# Patient Record
Sex: Female | Born: 1980 | Race: White | Hispanic: No | Marital: Married | Smoking: Never smoker
Health system: Southern US, Community
[De-identification: ages and names within clinical notes are randomized; demographics above are authoritative.]

## PROBLEM LIST (undated history)

## (undated) DIAGNOSIS — F329 Major depressive disorder, single episode, unspecified: Secondary | ICD-10-CM

## (undated) DIAGNOSIS — F419 Anxiety disorder, unspecified: Secondary | ICD-10-CM

## (undated) DIAGNOSIS — T8859XA Other complications of anesthesia, initial encounter: Secondary | ICD-10-CM

## (undated) DIAGNOSIS — G4730 Sleep apnea, unspecified: Secondary | ICD-10-CM

## (undated) DIAGNOSIS — I10 Essential (primary) hypertension: Secondary | ICD-10-CM

## (undated) DIAGNOSIS — R51 Headache: Secondary | ICD-10-CM

## (undated) DIAGNOSIS — R05 Cough: Secondary | ICD-10-CM

## (undated) DIAGNOSIS — R058 Other specified cough: Secondary | ICD-10-CM

## (undated) DIAGNOSIS — F32A Depression, unspecified: Secondary | ICD-10-CM

## (undated) DIAGNOSIS — T4145XA Adverse effect of unspecified anesthetic, initial encounter: Secondary | ICD-10-CM

## (undated) DIAGNOSIS — K449 Diaphragmatic hernia without obstruction or gangrene: Secondary | ICD-10-CM

## (undated) HISTORY — DX: Essential (primary) hypertension: I10

## (undated) HISTORY — DX: Diaphragmatic hernia without obstruction or gangrene: K44.9

## (undated) HISTORY — PX: APPENDECTOMY: SHX54

## (undated) HISTORY — PX: CHOLECYSTECTOMY: SHX55

## (undated) HISTORY — DX: Sleep apnea, unspecified: G47.30

---

## 1999-04-06 ENCOUNTER — Emergency Department (HOSPITAL_COMMUNITY): Admission: EM | Admit: 1999-04-06 | Discharge: 1999-04-06 | Payer: Self-pay | Admitting: Emergency Medicine

## 1999-10-29 ENCOUNTER — Other Ambulatory Visit: Admission: RE | Admit: 1999-10-29 | Discharge: 1999-10-29 | Payer: Self-pay | Admitting: Internal Medicine

## 2003-03-15 ENCOUNTER — Encounter: Payer: Self-pay | Admitting: *Deleted

## 2003-03-16 ENCOUNTER — Inpatient Hospital Stay (HOSPITAL_COMMUNITY): Admission: EM | Admit: 2003-03-16 | Discharge: 2003-03-18 | Payer: Self-pay | Admitting: Emergency Medicine

## 2003-03-16 ENCOUNTER — Encounter (INDEPENDENT_AMBULATORY_CARE_PROVIDER_SITE_OTHER): Payer: Self-pay | Admitting: Specialist

## 2003-03-28 ENCOUNTER — Other Ambulatory Visit: Admission: RE | Admit: 2003-03-28 | Discharge: 2003-03-28 | Payer: Self-pay | Admitting: Obstetrics and Gynecology

## 2003-05-13 ENCOUNTER — Inpatient Hospital Stay (HOSPITAL_COMMUNITY): Admission: AD | Admit: 2003-05-13 | Discharge: 2003-05-13 | Payer: Self-pay | Admitting: Obstetrics and Gynecology

## 2003-05-17 ENCOUNTER — Inpatient Hospital Stay (HOSPITAL_COMMUNITY): Admission: AD | Admit: 2003-05-17 | Discharge: 2003-05-17 | Payer: Self-pay | Admitting: Obstetrics and Gynecology

## 2003-08-21 ENCOUNTER — Inpatient Hospital Stay (HOSPITAL_COMMUNITY): Admission: AD | Admit: 2003-08-21 | Discharge: 2003-08-21 | Payer: Self-pay | Admitting: Obstetrics and Gynecology

## 2003-09-03 ENCOUNTER — Inpatient Hospital Stay (HOSPITAL_COMMUNITY): Admission: AD | Admit: 2003-09-03 | Discharge: 2003-09-06 | Payer: Self-pay | Admitting: Obstetrics and Gynecology

## 2003-10-18 ENCOUNTER — Other Ambulatory Visit: Admission: RE | Admit: 2003-10-18 | Discharge: 2003-10-18 | Payer: Self-pay | Admitting: Obstetrics and Gynecology

## 2003-10-28 ENCOUNTER — Encounter: Admission: RE | Admit: 2003-10-28 | Discharge: 2003-10-28 | Payer: Self-pay | Admitting: Family Medicine

## 2003-11-04 ENCOUNTER — Encounter (INDEPENDENT_AMBULATORY_CARE_PROVIDER_SITE_OTHER): Payer: Self-pay | Admitting: *Deleted

## 2003-11-05 ENCOUNTER — Inpatient Hospital Stay (HOSPITAL_COMMUNITY): Admission: RE | Admit: 2003-11-05 | Discharge: 2003-11-06 | Payer: Self-pay | Admitting: Surgery

## 2004-04-14 ENCOUNTER — Other Ambulatory Visit: Admission: RE | Admit: 2004-04-14 | Discharge: 2004-04-14 | Payer: Self-pay | Admitting: Obstetrics and Gynecology

## 2005-04-13 ENCOUNTER — Encounter: Admission: RE | Admit: 2005-04-13 | Discharge: 2005-04-13 | Payer: Self-pay | Admitting: Family Medicine

## 2005-11-02 ENCOUNTER — Other Ambulatory Visit: Admission: RE | Admit: 2005-11-02 | Discharge: 2005-11-02 | Payer: Self-pay | Admitting: Family Medicine

## 2008-10-17 ENCOUNTER — Inpatient Hospital Stay (HOSPITAL_COMMUNITY): Admission: AD | Admit: 2008-10-17 | Discharge: 2008-10-17 | Payer: Self-pay | Admitting: Obstetrics and Gynecology

## 2008-11-03 ENCOUNTER — Inpatient Hospital Stay (HOSPITAL_COMMUNITY): Admission: AD | Admit: 2008-11-03 | Discharge: 2008-11-06 | Payer: Self-pay | Admitting: Obstetrics and Gynecology

## 2008-12-05 ENCOUNTER — Encounter: Admission: RE | Admit: 2008-12-05 | Discharge: 2008-12-05 | Payer: Self-pay | Admitting: Family Medicine

## 2010-04-19 ENCOUNTER — Encounter: Payer: Self-pay | Admitting: Obstetrics and Gynecology

## 2010-07-04 LAB — CBC
HCT: 35 % — ABNORMAL LOW (ref 36.0–46.0)
Hemoglobin: 12 g/dL (ref 12.0–15.0)
Hemoglobin: 9.9 g/dL — ABNORMAL LOW (ref 12.0–15.0)
MCHC: 34.2 g/dL (ref 30.0–36.0)
MCV: 91.7 fL (ref 78.0–100.0)
Platelets: 212 10*3/uL (ref 150–400)
Platelets: 294 10*3/uL (ref 150–400)
RBC: 3.82 MIL/uL — ABNORMAL LOW (ref 3.87–5.11)
RDW: 12.9 % (ref 11.5–15.5)
RDW: 13.1 % (ref 11.5–15.5)
WBC: 11.7 10*3/uL — ABNORMAL HIGH (ref 4.0–10.5)

## 2010-07-04 LAB — COMPREHENSIVE METABOLIC PANEL WITH GFR
ALT: 13 U/L (ref 0–35)
AST: 19 U/L (ref 0–37)
Albumin: 3 g/dL — ABNORMAL LOW (ref 3.5–5.2)
Alkaline Phosphatase: 142 U/L — ABNORMAL HIGH (ref 39–117)
BUN: 4 mg/dL — ABNORMAL LOW (ref 6–23)
CO2: 19 meq/L (ref 19–32)
Calcium: 9.3 mg/dL (ref 8.4–10.5)
Chloride: 108 meq/L (ref 96–112)
Creatinine, Ser: 0.45 mg/dL (ref 0.4–1.2)
GFR calc non Af Amer: >60 mL/min
Glucose, Bld: 81 mg/dL (ref 70–99)
Potassium: 3.9 meq/L (ref 3.5–5.1)
Sodium: 136 meq/L (ref 135–145)
Total Bilirubin: 0.8 mg/dL (ref 0.3–1.2)
Total Protein: 6.2 g/dL (ref 6.0–8.3)

## 2010-07-04 LAB — LACTATE DEHYDROGENASE: LDH: 127 U/L (ref 94–250)

## 2010-07-04 LAB — URIC ACID: Uric Acid, Serum: 4.4 mg/dL (ref 2.4–7.0)

## 2010-08-14 NOTE — Discharge Summary (Signed)
[name], [name]                             ACCOUNT NO.:  [id number]   MEDICAL RECORD NO.:  [id number]                   PATIENT TYPE:  INP   LOCATION:  0478                                 FACILITY:  [facility]   PHYSICIAN:  Sandria Bales. Ezzard Standing, M.D.               DATE OF BIRTH:  Dec 13, 1980   DATE OF ADMISSION:  03/16/2003  DATE OF DISCHARGE:  03/18/2003                                 DISCHARGE SUMMARY   DISCHARGE DIAGNOSES:  1. Acute appendicitis.  2. Twelve-week intrauterine pregnancy.  3. Wellbutrin for anxiety and depression.  4. Hypertension.   OPERATION PERFORMED:  Patient had a laparoscopic appendectomy on March 16, 2003.   HISTORY OF ILLNESS:  This is a 30 year old white female who had abdominal  pain actually beginning about March 09, 2003.  Initially she was seen in  Dr. Lynnell Dike office and felt her abdominal pain could be possibly secondary  to a ovarian cyst however, as her pain got worse she represented to [facility] on Friday, March 15, 2003.  Dr. Malachy Mood who evaluated her obtained  a CT scan which shows apparent appendicitis.  The patient has also had a  known 12-week intrauterine pregnancy.   Her significant co-morbid conditions include she is on labetalol for blood  pressure and she is on Wellbutrin for anxiety/depression.   Patient's temperature was 99.6.  She had right lower quadrant lower  abdominal pain though not really significant.  Her white blood count was  19,000.   Discussion was carried out about the patient proceeding with appendectomy.  She understood with her pregnancy there is a risk of anesthesia and surgery  but there is also a risk for worsening appendicitis which was greater and  exceeded that of the surgery.   She was taken to the operating room on March 16, 2003.  Patient underwent  a laparoscopic appendectomy.   Postoperatively she did well.  Her first postoperative day she was a little  puny.  She had some foul-smelling  urine but a urinalysis was unremarkable.  She was kept on __________ IV antibiotics during her hospitalization for the  2 days but not being discharged on any antibiotics.   She is now ready for discharge.  She is given Vicodin for pain.  She is to  resume her home medication.  She can shower starting later today.  She  should do no driving or heavy lifting for about 2 or 3 days but then go  ahead  unrestricted physical activity.  She is to see me back in 3 weeks.  She  already has an appointment to see Dr. Vincente Poli she thinks on March 28, 2003.  Patient will keep that appointment.  She knows if she runs any fevers  or any other problems she will be in touch with either myself or Dr.  Lynnell Dike office.  Sandria Bales. Ezzard Standing, M.D.    DHN/MEDQ  D:  03/18/2003  T:  03/18/2003  Job:  [id number]   cc:   Gretta Arab. Valentina Lucks, M.D.  [address]  [place]  [place] [address]  Fax: [phone]   Stann Mainland. Vincente Poli, M.D.  [address] [place]  [place] [address]  Fax: [phone]

## 2010-08-14 NOTE — H&P (Signed)
NAMENEENAH, CANTER                             ACCOUNT NO.:  [id number]   MEDICAL RECORD NO.:  [id number]                   PATIENT TYPE:  EMS   LOCATION:  ED                                   FACILITY:  [facility]   PHYSICIAN:  Sandria Bales. Ezzard Standing, M.D.               DATE OF BIRTH:  12-27-1980   DATE OF ADMISSION:  03/16/2003  DATE OF DISCHARGE:                                HISTORY & PHYSICAL   HISTORY OF PRESENT ILLNESS:  This is a 30 year old white female who claims  to have had abdominal pain since last Saturday, which would be March 09, 2003.  She apparently on Sunday talked to, I guess, a nurse practitioner  with Dr. Vincente Poli who thought she may be having some constipation.  They  placed her on Peri-Colace, but she got no better by Wednesday.  Saw Dr.  Vincente Poli in the office who felt she had a cyst.  She started to feel a little  bit better on Wednesday and Thursday of this week, which would be December  15 and 16, 2004, but then Friday started feeling worse again and went to  [facility] at about 12:30.  She was seen and evaluated, found to have  an elevated white blood count, and a CT scan was obtained.   The reading of the CT scan by Dr. Lorenda Ishihara. Molli Posey shows what looks like  appendicitis.   The patient also is noted to be pregnant, at about 12 weeks intrauterine  pregnancy.   PAST MEDICAL HISTORY:  She has no allergies.   Her current medications include labetalol for blood pressure and Wellbutrin,  and then she is on perinatal vitamins.   REVIEW OF SYSTEMS:  NEUROLOGIC:  No history of seizure or loss of  consciousness.  PULMONARY:  No history of pneumonia or tuberculosis.  She  does not smoke cigarettes.  CARDIAC:  She has had hypertension  actually  since she has been about age 19, but no history of chest pain or angina.  GASTROINTESTINAL:  No history of peptic ulcer disease, liver disease,  pancreatic disease, prior abdominal surgery.  UROLOGIC:  No history of  kidney  stones or kidney infection.  There is her first pregnancy, and she is  accompanied by her husband in the emergency room.   PHYSICAL EXAM:  VITAL SIGNS:  Temperature is 99.6, pulse is 93, blood  pressure is 104/74, respirations are 18.  GENERAL:  Well-nourished, obese white female who is alert and cooperative on  physical exam.  HEENT:  Unremarkable.  She does have some acne on her face, and she is  somewhat ruddish in the face.  NECK:  Supple.  She has no mass or thyromegaly.  LUNGS:  Clear to auscultation.  HEART:  Regular rate and rhythm.  Without murmur or rub.  ABDOMEN:  Bowel sounds are present.  She has some lower abdominal tenderness  that is more central and to the right lower quadrant.  She really did not  have that much in the way of guarding or rebound.  She said she hurt a lot  when she hit bumps.  She says she was hurting a lot more when she first made  it to [facility].   Laboratories that I have show a white blood count a 19,000.  Her sodium is  136, potassium 3.8, chloride of 105, CO2 of 23, glucose of 102.  SGPT  slightly elevated at 55.  Her amylase is 64, lipase 25.  Urinalysis  unremarkable.   Again, she had CT scan evidence of appendicitis.   IMPRESSION:  1. Acute appendicitis.  I discussed the findings with her husband and the     patient.  I feel she would be best served with proceeding with     appendectomy.  Will plan to do this laparoscopically.  I told her there     is a chance this would be done open.  I talked about the potential     complications including bleeding, infection, open surgery, bowel injury.     I also talked about the possibility of creating early labor and     miscarriage, though I think the risk of leaving the appendix in is higher     than trying to proceed with the appendectomy.  I think the patient     understands all the benefits and risks of the procedure about going     ahead.  2. A 12-week intrauterine pregnancy.  3.  Wellbutrin for anxiety/depression.  4. Hypertension.                                               Sandria Bales. Ezzard Standing, M.D.    DHN/MEDQ  D:  03/16/2003  T:  03/16/2003  Job:  [id number]   cc:   Tracie Harrier, M.D.  [address] [employer]  [place] [address]  Fax: [phone]   Gretta Arab. Valentina Lucks, M.D.  [address]  [place]  [place] [address]  Fax: [phone]   Stann Mainland. Vincente Poli, M.D.  [address] [place]  [place] [address]  Fax: [phone]

## 2010-08-14 NOTE — H&P (Signed)
[name], [name]                             ACCOUNT NO.:  [id number]   MEDICAL RECORD NO.:  [id number]                   PATIENT TYPE:  INP   LOCATION:  9170                                 FACILITY:  [facility]   PHYSICIAN:  Michelle L. Vincente Poli, M.D.            DATE OF BIRTH:  24-Apr-1980   DATE OF ADMISSION:  09/03/2003  DATE OF DISCHARGE:                                HISTORY & PHYSICAL   HISTORY OF PRESENT ILLNESS:  This is a 30 year old gravida 1, para 0 with  estimated date of confinement of September 30, 2003 presents today for a 2 stage  induction, pending mature amniocentesis. The patient has a history of  chronic hypertension for which she takes 100 mg 2 p.o. q.d. She has had an  issue with borderline fluids with AFI with the last AFI on August 29, 2003 was  11 cm in the 30th percentile. Of note, ultrasound was done on August 29, 2003,  which revealed an estimated fetal weight of 5 pounds 1 ounce at 20th  percentile and biophysical profile was 8 out of 8.   PRENATAL CARE:  See [facility]. GBS is unknown.   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 The father of the baby was tested at [facility] in February  of 2005 and was found to be negative for this.   PAST MEDICAL HISTORY:  She also has a history of depression and anxiety.   PHYSICAL EXAMINATION:  VITAL SIGNS:  She is afebrile with stable vital  signs. Blood pressure is good today.  LUNGS:  Clear to auscultation bilaterally.  CARDIAC:  Regular rate and rhythm.  ABDOMEN:  Leopold's to 5 and 1/2 to 6 pounds.  PELVIC:  Cervix is long and closed.   IMPRESSION:  1. Intrauterine pregnancy at 29 and 2.  2. Chronic hypertension.   PLAN:  Depending on amnio, if mature, patient will be admitted this evening  for a 2 stage induction. She will continue her Labetalol. We will monitor  blood pressures closely.                                               Michelle L. Vincente Poli, M.D.    Florestine Avers  D:  09/03/2003  T:  09/03/2003  Job:  [id number]

## 2010-08-14 NOTE — Op Note (Signed)
NAMEANOUSHKA, [name]                             ACCOUNT NO.:  [id number]   MEDICAL RECORD NO.:  [id number]                   PATIENT TYPE:  OBV   LOCATION:  0445                                 FACILITY:  [facility]   PHYSICIAN:  Sandria Bales. Ezzard Standing, M.D.               DATE OF BIRTH:  [birth year]   DATE OF PROCEDURE:  11/04/2003  DATE OF DISCHARGE:                                 OPERATIVE REPORT   PREOPERATIVE DIAGNOSES:  1. Chronic cholecystitis and cholelithiasis.  2. Elevated liver functions.   POSTOPERATIVE DIAGNOSES:  1. Chronic cholecystitis and cholelithiasis.  2. Common bile duct obstruction distally, probably secondary to common bile     duct stones with elevated liver function.   PROCEDURE:  Laparoscopic cholecystectomy with intraoperative cholangiogram.   SURGEON:  Sandria Bales. Ezzard Standing, M.D.   FIRST ASSISTANT:  None.   ANESTHESIA:  General.   ESTIMATED BLOOD LOSS:  Minimal.   INDICATIONS FOR PROCEDURE:  [name] is a 30 year old white female who is  about eight weeks postpartum who comes with increasingly symptomatic  gallstones.  The indications and potential complications of the procedure  were explained to the patient.  The potential complications include but are  not limited to bleeding, infection, bile duct injury, and open surgery.   The patient was given general endotracheal anesthesia, given 1 gm of Ancef  at the initiation of the procedure.  An infraumbilical incision was made  with sharp dissection carried into the abdominal cavity.  A 0 degree  laparoscope was inserted through a 12 mm Hasson trocar and secured with a 0  Vicryl suture.  A 10 mm subxiphoid incision was made for a 10 mm trocar and  then two 5 mm right subcostal trocars.  Abdominal exploration revealed the  right and left lobes of the liver were unremarkable.  The bowel that could  be seen and the stomach were unremarkable.  There were no other masses,  lesions, or nodularity.  There was actually no  scarring from her prior  appendectomy.   The gallbladder was grasped and rotated cephalad.  It had a thickened wall  but was not exceptionally inflamed.  The patient was noted to have elevated  liver functions at the beginning of the procedure with a bilirubin of 1.9,  an alk phos of 304, SGOT of 260, SGPT of 203.   Intraoperative cholangiogram was obtained.  The cystic artery was identified  and ligated with Endoclips.  A clip placed on the gallbladder side of the  cystic duct and the intraoperative cholangiogram obtained using cut-off taut  catheter and inserted to the cut cystic duct.  I really had difficulty  getting the flow because of the valve system of the cystic duct.  When I  finally got to the cholangiogram, it did not empty distally.  Did fill up  the proximal hepatic radicals, and this was consistent with a distal  common  bile duct obstruction, although I saw no meniscal sign.  Neither was there  obvious taper, like edema or anything.   I tried to place a biliary Fogarty down the cystic duct.  I actually cut the  cystic duct a second time, trying to get closer to the common bile duct but  could not get past the valve system.  I could also not reinsert a taut  catheter shooting up the cholangiogram; therefore, I clipped the duct three  times, divided the cystic duct with the plans for doing a postoperative  ERCP.  The gallbladder was then sharply and bluntly dissected from the  gallbladder bed.  Prior to completing the division of the gallbladder from  the gallbladder bed, I relooked at the cystic duct.  There was no bile leak.  I relooked at the liver bed.  There was no bleeding or bile leak.  I then  divided the gallbladder from the gallbladder bed, put it in the EndoCatch  bag and delivered it through the umbilicus.  I then closed the umbilical  port with a 0 Vicryl suture.  The other ports were visualized and no  bleeding from them.  I closed the skin site.  The right  mid subcostal 5 mm  port did have some bruising.  I had to put a cutaneous stitch of 5-0  Monocryl.  The wounds were then painted with tincture of Benzoin, Steri-  Strip, and sterilely dressed.  The patient tolerated the procedure well.  Was transported to the recovery room in good condition.                                               Sandria Bales. Ezzard Standing, M.D.    DHN/MEDQ  D:  11/04/2003  T:  11/04/2003  Job:  [id number]   cc:   Gretta Arab. Valentina Lucks, M.D.  [address]  [place]  [place] [address]  Fax: [phone]   Stann Mainland. Vincente Poli, M.D.  [address]  [place]  [place] [address]  Fax: [phone]   Graylin Shiver, M.D.  [phone] [address]  [address]  [place], [place] [address]  Fax: [phone]

## 2010-08-14 NOTE — Consult Note (Signed)
[name], [name]                             ACCOUNT NO.:  [id number]   MEDICAL RECORD NO.:  [id number]                   PATIENT TYPE:  OBV   LOCATION:  0445                                 FACILITY:  [facility]   PHYSICIAN:  James L. Malon Kindle., M.D.          DATE OF BIRTH:  1981/03/15   DATE OF CONSULTATION:  11/04/2003  DATE OF DISCHARGE:                                   CONSULTATION   HISTORY:  A 30 year old woman who is approximately 10 months postpartum who  has had several weeks of epigastric pain, postoperative bleeding, and  nausea. She saw Dr. Maurice Small at this point and ultrasound of the  gallbladder was obtained showing multiple small stones of the gallbladder  and a laparoscopic cholecystectomy was performed today. Intraoperative  cholangiogram revealed no gross filling defect, however there was no  emptying of the distal bile duct and no passage of contrast into the  duodenum. Preoperatively, the patient's labs revealed a bilirubin of 1.9,  alkaline phosphatase 304, SGOT 260, SGPT 203. Amylase and lipase normal. It  was felt that the patient may well have a common bile duct stone. We are  asked to see her for further evaluation.   MEDICATIONS ON ADMISSION:  Labetalol.   ALLERGIES:  No known drug allergies.   PAST MEDICAL HISTORY:  1. High blood pressure.  2. Appendectomy.  3. Some sort of abnormality with __________  .  She is quite sedated now and     her husband is unclear on this, but apparently she had some defect, he     does not.  4. Depression.  5. Anxiety.   She had one baby who was born approximately 10 months ago.   FAMILY HISTORY:  Mother had her gallstones out for unclear reasons.   PHYSICAL EXAMINATION:  GENERAL: The patient has just come back up from the  OR. The patient is quite sedated for pain. This was given to her during the  interview and she __________  .  VITAL SIGNS: Normal.  HEENT: Eyes are full and nonicteric.  LUNGS: Clear.  HEART: Regular rate and rhythm without murmurs, rubs, or gallops.  ABDOMEN: Surgical scars. It is quiet and appears tender.   ASSESSMENT:  Abnormal postoperative cholangiogram, abnormal liver function  tests may well represent a common duct stone versus __________   PLAN:  Will go ahead and check the labs in the morning. Depending on the  results of that she may well need an ERCP.  I have discussed in detail with  her and in particular with her husband ERCP, sphincterotomy, the rationale  of her performing this procedure, and the risks of pancreatitis and bleeding  than can be associated with it.  James L. Malon Kindle., M.D.    Waldron Session  D:  11/04/2003  T:  11/04/2003  Job:  [id number]   cc:   Gretta Arab. Valentina Lucks, M.D.  [address] [place]  [place] [address]  Fax: [phone]

## 2010-08-14 NOTE — Discharge Summary (Signed)
[name], [name]                             ACCOUNT NO.:  [id number]   MEDICAL RECORD NO.:  [id number]                   PATIENT TYPE:  INP   LOCATION:  0445                                 FACILITY:  [facility]   PHYSICIAN:  Sandria Bales. Ezzard Standing, M.D.               DATE OF BIRTH:  01-25-1981   DATE OF ADMISSION:  11/04/2003  DATE OF DISCHARGE:  11/06/2003                                 DISCHARGE SUMMARY   DISCHARGE DIAGNOSES:  1.  Chronic cholecystitis with cholelithiasis.  2.  Common bile duct stones with obstruction.  3.  Elevated liver functions secondary to common bile duct stones.  4.  Hypertension.  5.  Recent pregnancy.   OPERATIONS PERFORMED:  The patient had a laparoscopic cholecystectomy with  intraoperative cholangiogram on November 04, 2003, and then had an endoscopic  retrograde cholangiopancreatography with sphincterotomy on November 05, 2003,  by Dr. Wandalee Ferdinand.   HISTORY OF PRESENT ILLNESS:  [name] is a 30 year old white female,  patient of Dr. Maurice Small, who is well known to me for a laparoscopic  appendectomy that I performed on her in December 2004, during her pregnancy.  She delivered a healthy female approximately eight weeks prior to this  admission.  During her pregnancy she developed recurrent epigastric and  right upper quadrant abdominal pain which initially was attributed to gas  pain and pregnancy, however, approximately on October 25, 2003, she developed  severe pain, went to the [facility], had an ultrasound of her  gallbladder which showed multiple gallstones.   She now presents for attempted laparoscopic cholecystectomy.   PAST MEDICAL HISTORY:  High blood pressure and is being treated for this.  She is otherwise healthy, though moderately obese.   LABORATORY DATA:  Her admission labs did show some elevated liver function  tests with an AST of 260, and ALT of 203, and alkaline phosphatase of 304,  total bilirubin of 1.9.   HOSPITAL COURSE:   She was taken to the operating room on the day of  admission where she underwent a laparoscopic cholecystectomy with an  intraoperative cholangiogram.  Intraoperative cholangiogram showed lack of  emptying of the common bile duct which is probably secondary to a distal  common bile duct stone.  Again, she was noted to have elevated liver  function tests.   I spoke with Dr. Berton Mount who was on call that day for [facility].  He  then got Dr. Wandalee Ferdinand involved.  Dr. Evette Cristal did an ERCP with sphincterotomy  on November 05, 2003, and did find a distal common bile duct stone which was  impacted obstructing her common bile duct.  The following day on November 06, 2003, she was afebrile, she was doing well with minimal discomfort.  Her  liver functions actually bumped up a little bit further with a bilirubin of  5.4, alkaline phosphatase was 364.  However, she clinically was much  improved.  I think her problem with the stone had been identified, and this  was a transient elevation post-procedure, and therefore she was ready to be  discharged home.   DISCHARGE MEDICATIONS:  1.  Vicodin for pain.  2.  Resume her home medications.   DIET:  Low fat diet for at least one month.   FOLLOWUP:  See me back in two to three weeks for followup, and to call for  any problems that she may have.                                               Sandria Bales. Ezzard Standing, M.D.    DHN/MEDQ  D:  11/26/2003  T:  11/26/2003  Job:  [id number]   cc:   Gretta Arab. Valentina Lucks, M.D.  [address] [place]  [place] [address]  Fax: [phone]   Graylin Shiver, M.D.  [address]  [address]  [place], [place] [address]  Fax: [phone]

## 2010-08-14 NOTE — Op Note (Signed)
NAMEWILMARY, LEVIT                             ACCOUNT NO.:  [id number]   MEDICAL RECORD NO.:  [id number]                   PATIENT TYPE:  EMS   LOCATION:  ED                                   FACILITY:  [facility]   PHYSICIAN:  Sandria Bales. Ezzard Standing, M.D.               DATE OF BIRTH:  [birth year]   DATE OF PROCEDURE:  03/16/2003  DATE OF DISCHARGE:                                 OPERATIVE REPORT   PREOPERATIVE DIAGNOSIS:  Acute appendicitis.   POSTOPERATIVE DIAGNOSIS:  Acute appendicitis.   PROCEDURE:  Laparoscopic appendectomy.   SURGEON:  Sandria Bales. Ezzard Standing, MD.   FIRST ASSISTANT:  None.   ANESTHESIA:  General endotracheal.   ESTIMATED BLOOD LOSS:  Minimal.   INDICATIONS FOR PROCEDURE:  Ms. Raabe is a 30 year old, white female, who is  [redacted] weeks pregnant, who comes with diagnosis of acute appendicitis.  She has  actually had some symptoms for 5-6 days, and I'm not sure these are all  symptoms due to appendicitis.  Anyway, I discussed with the patient and her  husband the indications and potential risks for the surgery.  Potential  risks include but not limited to bleeding, infection, bowel injury, open  surgery, and the possibility of premature labor/miscarriage.  The patient  understands the risks of the procedure, also understands the risk of a  progressive appendicitis and the danger it poses.  The patient now presents  to the operating room.  She has been given ciprofloxacin as an antibiotic.  She had a Foley catheter in place.   DESCRIPTION OF PROCEDURE:  Her abdomen is prepped with Betadine solution and  sterilely draped.  I went through an infraumbilical incision and got into  the abdominal cavity.  Abdominal exploration carried out.  The patient has a  gravid uterus, but the uterus is not all that big, and it is not really  affecting any evaluation of the pelvis.  Her appendix was actually stuck at  her pelvic brim.  I had to flip this up, but actually was able to mobilize  it  once I got it unstuck.  I could not see any injury to any bowel or any  stuck bowel around it.  I was then able to take the mesentery of the  appendix down using harmonic scalpel.  Stapled across the base of the  appendix was a standard white load of the EndoGIA stapler, a 45 mm.  I then  place the appendix in an EndoCatch bag and delivered it through the  umbilicus.  I irrigated the abdomen out with about 500 mL of saline.  I  reexamined the staple line which was intact, the bowel which was intact, saw  no other cause of pathology, and then removed all the trocars under direct  visualization.  The umbilical trocar port was closed with a 0 Vicryl suture.  Each port was infiltrated with 0.25% Marcaine,  using a total of about 17 mL.  I then closed the incisions with a 5-0 Monocryl suture.  The patient then  transferred to the recovery room in good condition.  Sponge and needle count  were correct at the end of the case.                                               Sandria Bales. Ezzard Standing, M.D.    DHN/MEDQ  D:  03/16/2003  T:  03/16/2003  Job:  [id number]   cc:   Marcelino Duster L. Vincente Poli, M.D.  [address], [address]  [place]  [place] [address]  Fax: [phone]   Tracie Harrier, M.D.  [address] [place]  [place] [address]  Fax: [phone]   Gretta Arab. Valentina Lucks, M.D.  [address] [place]  [place] [address]  Fax: [phone]

## 2010-08-14 NOTE — Op Note (Signed)
NAMEJASKIRAT, [name]                             ACCOUNT NO.:  [id number]   MEDICAL RECORD NO.:  [id number]                   PATIENT TYPE:  OBV   LOCATION:  0445                                 [facility]ACILITY:  [facility]   PHYSICIAN:  Graylin Shiver, M.D.                DATE OF BIRTH:  1980/06/19   DATE OF PROCEDURE:  11/05/2003  DATE OF DISCHARGE:                                 OPERATIVE REPORT   PROCEDURE:  Endoscopic retrograde cholangiogram with sphincterotomy and  stone extraction.   INDICATIONS FOR PROCEDURE:  This patient is a 30 year old female one day  status post laparoscopic cholecystectomy.  Intraoperative cholangiograms  reveal the probability of a distal common bile duct stone, as no contrast  would pass into the duodenum.  Today, her liver enzymes were still elevated,  and the patient was experiencing right upper quadrant abdominal pain.  It  was felt that ERCP was indicated.   Informed consent was obtained after explanation of the risks of bleeding,  infection, perforation, and pancreatitis.   PREMEDICATIONS:  Fentanyl 100 mcg IV, Versed 12 mg IV, Glucagon 0.5 mg IV.   PROCEDURE:  With the patient in the left lateral decubitus position, the  Olympus lateral-viewing duodenoscope was inserted into the oropharynx and  passed into the esophagus.  It was advanced down the esophagus and into the  stomach.  No abnormalities were seen in the stomach.  The scope was then  advanced into the duodenum.  No abnormalities were seen in the duodenal  bulb.  The papilla of Vater was located in the second portion of the  duodenum, and the papilla appeared to be bulging.  Right at the orifice, I  could see a yellowish-black discoloration, which appeared to me that the tip  of the stone was trying to pass out of the papilla and was, in fact,  impacted in this region.  Cannulation of the common bile duct was achieved  using a guidewire and papillotome.  A small sphincterotomy was made after  proper localization of the sphincterotome.  With this small sphincterotomy,  a round pea-sized stone was extracted from the common bile duct ampullary  area.  After this, good bile flow was achieved.  The papillotome was then  removed.  The guidewire was left in place.  I then advanced an 8.5 mm  balloon up the bile duct after injection of the biliary tree had been done  and showed no further filling defects.  The duct was swept.  No further  defects were noted.  The balloon was deflated and brought out of the  papilla.  Final picture showed clearance of the common bile duct and biliary  tree.  The pancreatic duct was not injected during this examination.  It was  cannulated once initially with only a guidewire.  She tolerated the  procedure well without complications.   IMPRESSION:  Impacted common bile  duct stone in the region of the ampulla of  Vater.  This was removed.                                               Graylin Shiver, M.D.    [name]  D:  11/05/2003  T:  11/05/2003  Job:  [id number]   cc:   Sandria Bales. Ezzard Standing, M.D.  [address]  [place]  [place] [address]  Fax: [phone]

## 2012-01-05 ENCOUNTER — Other Ambulatory Visit: Payer: Self-pay | Admitting: Obstetrics and Gynecology

## 2013-04-19 ENCOUNTER — Other Ambulatory Visit: Payer: Self-pay | Admitting: Dermatology

## 2013-06-13 ENCOUNTER — Other Ambulatory Visit: Payer: Self-pay | Admitting: Obstetrics and Gynecology

## 2013-06-21 ENCOUNTER — Encounter (INDEPENDENT_AMBULATORY_CARE_PROVIDER_SITE_OTHER): Payer: Self-pay | Admitting: Surgery

## 2013-06-21 ENCOUNTER — Ambulatory Visit (INDEPENDENT_AMBULATORY_CARE_PROVIDER_SITE_OTHER): Payer: BC Managed Care – PPO | Admitting: Surgery

## 2013-06-21 NOTE — Progress Notes (Signed)
Re:   [name] DOB:   September 23, 1980 MRN:   [id number]  ASSESSMENT AND PLAN: 1.  Morbid obesity  Initial weight - 276, BMI 42.6  Per the 1991 NIH Consensus Statement, the patient is a candidate for bariatric surgery.  The patient attended our initial information session and reviewed the types of bariatric surgery.    The patient is interested in the Sleeve Gastrectomy.  I discussed with the patient the indications and risks of bariatric surgery.  The potential risks of surgery include, but are not limited to, bleeding, infection, leak from the bowel, DVT and PE, open surgery, long term nutrition consequences, and death.  The patient understands the importance of compliance and long term follow-up with our group after surgery.  She understands that it is the newest of our operations and that we have the least experience with the surgery. I have started doing this surgery, but do not have large numbers. The preliminary results and risks seem to be between the lap band and the gastric bypass.  From here we will obtain lab tests, x-rays, nutrition consult, and psych consult.  2.  HTN 3.  OSA 4.  On birth control pills  Chief Complaint  Patient presents with  . Bariatric Pre-op    sleeve   REFERRING PHYSICIAN: Astrid Divine, MD  HISTORY OF PRESENT ILLNESS: [name] is a 33 y.o. (DOB: 22-Jun-1980)  white  female whose primary care physician is Astrid Divine, MD and comes to me today for weight loss surgery.  Patient says that she has been overweight since age 7. She has tried multiple diets during that time. She went to the information session by Dr. Ovidio Kin. She has a friend of a friend who had a sleeve gastrectomy. She knows patient had a gastric bypass. But the gastric bypass scares her.  She has tried Weight Watchers x2, low calorie diet, LA Diet, and multiple other diets. She tried Slim fast and phentermine. She had a mall success with about a 20 pound weight loss  on Weight Watchers one time, but nothing sustained.  Note her husband does most of the cooking at home.  But she is already trying to change what they have in their home.   No past medical history on file.   No past surgical history on file.    Current Outpatient Prescriptions  Medication Sig Dispense Refill  . ALPRAZolam (XANAX) 0.25 MG tablet       . AMETHYST 90-20 MCG tablet       . hydrochlorothiazide (MICROZIDE) 12.5 MG capsule Take 12.5 mg by mouth daily.      Marland Kitchen FLUoxetine (PROZAC) 20 MG capsule        No current facility-administered medications for this visit.     No Known Allergies  REVIEW OF SYSTEMS: Skin:  No history of rash.  No history of abnormal moles. Infection:  No history of hepatitis or HIV.  No history of MRSA. Neurologic:  No history of stroke.  No history of seizure.  No history of headaches. Cardiac:  HTN x 1 years.  No history of seeing a cardiologist. Pulmonary:  Has OSA since around 2008.  But cannot wear CPAP.  She actually thinks it may be better, though now her husband's snoring bothers her.  Endocrine:  No diabetes. No thyroid disease. Gastrointestinal:  No history of stomach disease.  No history of liver disease.  Dr. Ezzard Standing did lap chole 11/04/2003.  She had CBD stones - Dr. Luan Moore  did ERCP - 8.11/2013 No history of pancreas disease.  No history of colon disease.  Dr. Ezzard StandingNewman did lap appendectomy 03/16/2003. Urologic:  No history of kidney stones.  No history of bladder infections. Musculoskeletal:  No history of joint or back disease. Hematologic:  No bleeding disorder.  No history of anemia.  Not anticoagulated. Psycho-social:  The patient is oriented.   The patient has no obvious psychologic or social impairment to understanding our conversation and plan.  SOCIAL and FAMILY HISTORY: Married. Works as Agricultural engineerdirector of graduate school admissions at [employer] Has 2 boys:  Ages 4 and 289.  PHYSICAL EXAM: BP 132/62  Pulse 68  Temp(Src) 98 F (36.7 C)   Resp 18  Ht 5' 7.5" (1.715 m)  Wt 276 lb 3.2 oz (125.283 kg)  BMI 42.60 kg/m2  General: WN obese WF who is alert and generally healthy appearing.  HEENT: Normal. Pupils equal. Neck: Supple. No mass.  No thyroid mass. Lymph Nodes:  No supraclavicular or cervical nodes. Lungs: Clear to auscultation and symmetric breath sounds. Heart:  RRR. No murmur or rub. Abdomen: Soft. No mass. No tenderness. No hernia. Normal bowel sounds.  Umbilical scar.  She is about half pear and half apple. Rectal: Not done. Extremities:  Good strength and ROM  in upper and lower extremities. Neurologic:  Grossly intact to motor and sensory function. Psychiatric: Has normal mood and affect. Behavior is normal.   DATA REVIEWED: Epic notes  Ovidio Kinavid Tramond Slinker, MD,  [facility] [address]  [address]   [place]    [id number] Phone:  [phone] FAX:  [phone]

## 2013-06-22 ENCOUNTER — Other Ambulatory Visit (INDEPENDENT_AMBULATORY_CARE_PROVIDER_SITE_OTHER): Payer: Self-pay

## 2013-07-04 ENCOUNTER — Ambulatory Visit (HOSPITAL_COMMUNITY)
Admission: RE | Admit: 2013-07-04 | Discharge: 2013-07-04 | Disposition: A | Discharge status: Home / Self Care | Payer: BC Managed Care – PPO | Source: Ambulatory Visit | Attending: Surgery | Admitting: Surgery

## 2013-07-04 DIAGNOSIS — Z6841 Body Mass Index (BMI) 40.0 and over, adult: Secondary | ICD-10-CM | POA: Insufficient documentation

## 2013-07-04 DIAGNOSIS — Z9089 Acquired absence of other organs: Secondary | ICD-10-CM | POA: Insufficient documentation

## 2013-07-04 DIAGNOSIS — I10 Essential (primary) hypertension: Secondary | ICD-10-CM | POA: Insufficient documentation

## 2013-07-04 DIAGNOSIS — K449 Diaphragmatic hernia without obstruction or gangrene: Secondary | ICD-10-CM | POA: Insufficient documentation

## 2013-07-04 DIAGNOSIS — G4733 Obstructive sleep apnea (adult) (pediatric): Secondary | ICD-10-CM | POA: Insufficient documentation

## 2013-07-09 ENCOUNTER — Ambulatory Visit: Payer: Self-pay | Admitting: Dietician

## 2013-07-20 ENCOUNTER — Ambulatory Visit (HOSPITAL_COMMUNITY)
Admission: RE | Admit: 2013-07-20 | Discharge: 2013-07-20 | Disposition: A | Discharge status: Home / Self Care | Payer: BC Managed Care – PPO | Source: Ambulatory Visit | Attending: Surgery | Admitting: Surgery

## 2013-07-20 ENCOUNTER — Encounter (HOSPITAL_COMMUNITY)
Admission: RE | Disposition: A | Discharge status: Home / Self Care | Payer: BC Managed Care – PPO | Source: Ambulatory Visit | Attending: Surgery

## 2013-07-20 HISTORY — PX: BREATH TEK H PYLORI: SHX5422

## 2013-07-20 SURGERY — BREATH TEST, FOR HELICOBACTER PYLORI

## 2013-07-23 ENCOUNTER — Encounter (HOSPITAL_COMMUNITY): Payer: Self-pay | Admitting: Surgery

## 2013-08-04 ENCOUNTER — Encounter: Payer: BC Managed Care – PPO | Attending: Surgery | Admitting: Dietician

## 2013-08-04 ENCOUNTER — Encounter: Payer: Self-pay | Admitting: Dietician

## 2013-08-04 DIAGNOSIS — Z01818 Encounter for other preprocedural examination: Secondary | ICD-10-CM | POA: Insufficient documentation

## 2013-08-04 DIAGNOSIS — Z713 Dietary counseling and surveillance: Secondary | ICD-10-CM | POA: Insufficient documentation

## 2013-08-04 NOTE — Progress Notes (Signed)
  Pre-Op Assessment Visit:  Pre-Operative Gastric sleeve Surgery  Medical Nutrition Therapy:  Appt start time: 1245   End time:  1330.  Patient was seen on 08/04/2013 for Pre-Operative Gastric sleeve Nutrition Assessment. Assessment [facility] coordinator on 08/04/2013.   Preferred Learning Style:   No preference indicated   Learning Readiness:   Ready  Handouts given during visit include:  Pre-Op Goals Bariatric Surgery Protein Shakes Bariatric fast food guide Phase 3A-High protein foods  Teaching Method Utilized:  Visual Auditory Hands on  Barriers to learning/adherence to lifestyle change: none  Demonstrated degree of understanding via:  Teach Back   [facility] to call the [facility] to enroll in Pre-Op and Post-Op Nutrition Education when surgery date is scheduled.

## 2013-08-13 LAB — CBC WITH DIFFERENTIAL/PLATELET
BASOS ABS: 0 10*3/uL (ref 0.0–0.1)
BASOS PCT: 0 % (ref 0–1)
EOS ABS: 0.1 10*3/uL (ref 0.0–0.7)
EOS PCT: 2 % (ref 0–5)
HEMATOCRIT: 36.1 % (ref 36.0–46.0)
Hemoglobin: 12.5 g/dL (ref 12.0–15.0)
Lymphocytes Relative: 33 % (ref 12–46)
Lymphs Abs: 2 10*3/uL (ref 0.7–4.0)
MCH: 29.6 pg (ref 26.0–34.0)
MCHC: 34.6 g/dL (ref 30.0–36.0)
MCV: 85.5 fL (ref 78.0–100.0)
MONO ABS: 0.3 10*3/uL (ref 0.1–1.0)
Monocytes Relative: 5 % (ref 3–12)
Neutro Abs: 3.6 10*3/uL (ref 1.7–7.7)
Neutrophils Relative %: 60 % (ref 43–77)
PLATELETS: 325 10*3/uL (ref 150–400)
RBC: 4.22 MIL/uL (ref 3.87–5.11)
RDW: 12.7 % (ref 11.5–15.5)
WBC: 6 10*3/uL (ref 4.0–10.5)

## 2013-08-13 LAB — COMPREHENSIVE METABOLIC PANEL
ALT: 21 U/L (ref 0–35)
AST: 18 U/L (ref 0–37)
Albumin: 3.9 g/dL (ref 3.5–5.2)
Alkaline Phosphatase: 70 U/L (ref 39–117)
BILIRUBIN TOTAL: 0.7 mg/dL (ref 0.2–1.2)
BUN: 9 mg/dL (ref 6–23)
CALCIUM: 9.1 mg/dL (ref 8.4–10.5)
CHLORIDE: 107 meq/L (ref 96–112)
CO2: 24 mEq/L (ref 19–32)
CREATININE: 0.72 mg/dL (ref 0.50–1.10)
Glucose, Bld: 83 mg/dL (ref 70–99)
Potassium: 4.1 mEq/L (ref 3.5–5.3)
Sodium: 138 mEq/L (ref 135–145)
Total Protein: 6.8 g/dL (ref 6.0–8.3)

## 2013-08-14 LAB — TSH: TSH: 1.655 u[IU]/mL (ref 0.350–4.500)

## 2013-08-30 ENCOUNTER — Other Ambulatory Visit (INDEPENDENT_AMBULATORY_CARE_PROVIDER_SITE_OTHER): Payer: Self-pay | Admitting: Surgery

## 2013-09-03 ENCOUNTER — Encounter: Payer: BC Managed Care – PPO | Attending: Surgery

## 2013-09-03 DIAGNOSIS — Z713 Dietary counseling and surveillance: Secondary | ICD-10-CM | POA: Insufficient documentation

## 2013-09-03 DIAGNOSIS — Z01818 Encounter for other preprocedural examination: Secondary | ICD-10-CM | POA: Insufficient documentation

## 2013-09-05 ENCOUNTER — Encounter (HOSPITAL_COMMUNITY): Payer: Self-pay | Admitting: Pharmacy Technician

## 2013-09-05 NOTE — Progress Notes (Signed)
  Pre-Operative Nutrition Class:  Appt start time: 0102   End time:  1830.  Patient was seen on 09/03/2013 for Pre-Operative Bariatric Surgery Education at the [facility].   Surgery date: 09/18/2013 Surgery type: Gastric sleeve Start weight at [facility]: 272 lbs on 08/04/2013 Weight today: 273.5 lbs  TANITA  BODY COMP RESULTS  09/03/2013   BMI (kg/m^2) 42.2   Fat Mass (lbs) 139   Fat Free Mass (lbs) 134.5   Total Body Water (lbs) 98.5   Samples given per MNT protocol. Patient educated on appropriate usage: Unjury protein powder (strawberry - qty 1) Lot #: [id number] Exp: 05/2014  Premier protein shake (vanilla - qty 1) Lot #: [id number] Exp: 12/2013  Celebrate Vitamins Multivitamin (mandarin orange - qty 1) Lot #: [id number] Exp: 08/2014  Bariatric Advantage Calcium Citrate (cinnamon - qty 1) Lot #: [id number] Exp: 10/2013   The following the learning objectives were met by the patient during this course:  Identify Pre-Op Dietary Goals and will begin 2 weeks pre-operatively  Identify appropriate sources of fluids and proteins   State protein recommendations and appropriate sources pre and post-operatively  Identify Post-Operative Dietary Goals and will follow for 2 weeks post-operatively  Identify appropriate multivitamin and calcium sources  Describe the need for physical activity post-operatively and will follow MD recommendations  State when to call healthcare provider regarding medication questions or post-operative complications  Handouts given during class include:  Pre-Op Bariatric Surgery Diet Handout  Protein Shake Handout  Post-Op Bariatric Surgery Nutrition Handout  BELT Program Information Flyer  Support Group Information Flyer  [facility] Bariatric Supplements Price List  Follow-Up Plan: Patient will follow-up at [facility] 2 weeks post operatively for diet advancement per MD.

## 2013-09-07 NOTE — Patient Instructions (Addendum)
20     Your procedure is scheduled on:  Tuesday September 18, 2013  [facility] and follow signs to Short Stay  at 1040 AM.  Call this number if you have problems the night before or morning of surgery:  [phone]   Remember:             IF YOU USE CPAP,BRING MASK AND TUBING AM OF SURGERY!             IF YOU DO NOT HAVE YOUR TYPE AND SCREEN DRAWN AT PRE-ADMIT APPOINTMENT, YOU WILL HAVE IT DRAWN AM OF SURGERY!   Do not eat food or drink liquids AFTER MIDNIGHT!  Take these medicines the morning of surgery with A SIP OF WATER:     [facility] IS NOT RESPONSIBLE FOR ANY BELONGINGS OR VALUABLES BROUGHT TO HOSPITAL.  Marland Kitchen.  Leave suitcase in the car. After surgery it may be brought to your room.  For patients admitted to the hospital, checkout time is 11:00 AM the day of              Discharge.    DO NOT WEAR            JEWELRY,MAKE-UP,LOTIONS,POWDERS,PERFUMES,CONTACTS , DENTURES OR BRIDGEWORK ,AND DO NOT WEAR FALSE EYELASHES                                    Patients discharged the day of surgery will not be allowed to drive home.                If going home the same day of surgery, must have someone stay with you                first 24 hrs.at home and arrange for someone to drive you home from the              Hospital.              YOUR DRIVER IS:   Special Instructions:              Please read over the following fact sheets that you were given:             1. [place] PREPARING FOR SURGERY SHEET              2.[facility]                                        WadenaBeverly M.Cathlean Saueranney,RN,BSN     [phone]                              [facility] - Preparing for Surgery Before surgery, you can play an important role.  Because skin is not sterile, your skin needs to be as free of germs as possible.  You can reduce the number of germs on your skin by washing with CHG (chlorahexidine gluconate) soap before surgery.  CHG is an antiseptic cleaner which  kills germs and bonds with the skin to continue killing germs even after washing. Please DO NOT use if you have an allergy to CHG or antibacterial soaps.  If your skin becomes reddened/irritated stop using the CHG and inform your nurse when you arrive  at Short Stay. Do not shave (including legs and underarms) for at least 48 hours prior to the first CHG shower.  You may shave your face/neck. Please follow these instructions carefully:  1.  Shower with CHG Soap the night before surgery and the  morning of Surgery.  2.  If you choose to wash your hair, wash your hair first as usual with your  normal  shampoo.  3.  After you shampoo, rinse your hair and body thoroughly to remove the  shampoo.                           4.  Use CHG as you would any other liquid soap.  You can apply chg directly  to the skin and wash                       Gently with a scrungie or clean washcloth.  5.  Apply the CHG Soap to your body ONLY FROM THE NECK DOWN.   Do not use on face/ open                           Wound or open sores. Avoid contact with eyes, ears mouth and genitals (private parts).                       Wash face,  Genitals (private parts) with your normal soap.             6.  Wash thoroughly, paying special attention to the area where your surgery  will be performed.  7.  Thoroughly rinse your body with warm water from the neck down.  8.  DO NOT shower/wash with your normal soap after using and rinsing off  the CHG Soap.                9.  Pat yourself dry with a clean towel.            10.  Wear clean pajamas.            11.  Place clean sheets on your bed the night of your first shower and do not  sleep with pets. Day of Surgery : Do not apply any lotions/deodorants the morning of surgery.  Please wear clean clothes to the hospital/surgery center.  FAILURE TO FOLLOW THESE INSTRUCTIONS MAY RESULT IN THE CANCELLATION OF YOUR SURGERY PATIENT SIGNATURE_________________________________  NURSE  SIGNATURE__________________________________  ________________________________________________________________________   Rogelia Mire  An incentive spirometer is a tool that can help keep your lungs clear and active. This tool measures how well you are filling your lungs with each breath. Taking long deep breaths may help reverse or decrease the chance of developing breathing (pulmonary) problems (especially infection) following:  A long period of time when you are unable to move or be active. BEFORE THE PROCEDURE   If the spirometer includes an indicator to show your best effort, your nurse or respiratory therapist will set it to a desired goal.  If possible, sit up straight or lean slightly forward. Try not to slouch.  Hold the incentive spirometer in an upright position. INSTRUCTIONS FOR USE  1. Sit on the edge of your bed if possible, or sit up as far as you can in bed or on a chair. 2. Hold the incentive spirometer in an upright position. 3. Breathe out normally. 4. Place  the mouthpiece in your mouth and seal your lips tightly around it. 5. Breathe in slowly and as deeply as possible, raising the piston or the ball toward the top of the column. 6. Hold your breath for 3-5 seconds or for as long as possible. Allow the piston or ball to fall to the bottom of the column. 7. Remove the mouthpiece from your mouth and breathe out normally. 8. Rest for a few seconds and repeat Steps 1 through 7 at least 10 times every 1-2 hours when you are awake. Take your time and take a few normal breaths between deep breaths. 9. The spirometer may include an indicator to show your best effort. Use the indicator as a goal to work toward during each repetition. 10. After each set of 10 deep breaths, practice coughing to be sure your lungs are clear. If you have an incision (the cut made at the time of surgery), support your incision when coughing by placing a pillow or rolled up towels firmly  against it. Once you are able to get out of bed, walk around indoors and cough well. You may stop using the incentive spirometer when instructed by your caregiver.  RISKS AND COMPLICATIONS  Take your time so you do not get dizzy or light-headed.  If you are in pain, you may need to take or ask for pain medication before doing incentive spirometry. It is harder to take a deep breath if you are having pain. AFTER USE  Rest and breathe slowly and easily.  It can be helpful to keep track of a log of your progress. Your caregiver can provide you with a simple table to help with this. If you are using the spirometer at home, follow these instructions: SEEK MEDICAL CARE IF:   You are having difficultly using the spirometer.  You have trouble using the spirometer as often as instructed.  Your pain medication is not giving enough relief while using the spirometer.  You develop fever of 100.5 F (38.1 C) or higher. SEEK IMMEDIATE MEDICAL CARE IF:   You cough up bloody sputum that had not been present before.  You develop fever of 102 F (38.9 C) or greater.  You develop worsening pain at or near the incision site. MAKE SURE YOU:   Understand these instructions.  Will watch your condition. Will get help right away if you are not doing well or get worse.            Bing NeighborsKelly Hagerty  09/10/2013                           YOUR PROCEDURE IS SCHEDULED ON: 09/18/13                ENTER THRU [facility] MAIN HOSPITAL ENTRANCE AND                 FOLLOW  SIGNS TO SHORT STAY CENTER                 ARRIVE AT SHORT STAY AT: 10:40               CALL THIS NUMBER IF ANY PROBLEMS THE DAY OF SURGERY :               [phone]  REMEMBER:   Do not eat food or drink liquids AFTER MIDNIGHT   May have clear liquids UNTIL 6 HOURS BEFORE SURGERY (6:30 AM)               Take these medicines the morning of surgery with               A SIPS OF WATER :    XANAX IF NEEDED      Do not wear jewelry, make-up   Do not wear lotions, powders, or perfumes.   Do not shave legs or underarms 12 hrs. before surgery (men may shave face)  Do not bring valuables to the hospital.  Contacts, dentures or bridgework may not be worn into surgery.  Leave suitcase in the car. After surgery it may be brought to your room.  For patients admitted to the hospital more than one night, checkout time is            11:00 AM                                                       The day of discharge.   Patients discharged the day of surgery will not be allowed to drive home.            If going home same day of surgery, must have someone stay with you              FIRST 24 hrs at home and arrange for some one to drive you              home from hospital.   ________________________________________________________________________                                                                        [facility] - PREPARING FOR SURGERY  Before surgery, you can play an important role.  Because skin is not sterile, your skin needs to be as free of germs as possible.  You can reduce the number of germs on your skin by washing with CHG (chlorahexidine gluconate) soap before surgery.  CHG is an antiseptic cleaner which kills germs and bonds with the skin to continue killing germs even after washing. Please DO NOT use if you have an allergy to CHG or antibacterial soaps.  If your skin becomes reddened/irritated stop using the CHG and inform your nurse when you arrive at Short Stay. Do not shave (including legs and underarms) for at least 48 hours prior to the first CHG shower.  You may shave your face. Please follow these instructions carefully:   1.  Shower with CHG Soap the night before surgery and the  morning of Surgery.   2.  If you choose to wash your hair, wash your hair first as usual with your  normal  Shampoo.   3.  After you shampoo, rinse your hair and body thoroughly to remove  the  shampoo.  4.  Use CHG as you would any other liquid soap.  You can apply chg directly  to the skin and wash . Gently wash with scrungie or clean wascloth    5.  Apply the CHG Soap to your body ONLY FROM THE NECK DOWN.   Do not use on open                           Wound or open sores. Avoid contact with eyes, ears mouth and genitals (private parts).                        Genitals (private parts) with your normal soap.              6.  Wash thoroughly, paying special attention to the area where your surgery  will be performed.   7.  Thoroughly rinse your body with warm water from the neck down.   8.  DO NOT shower/wash with your normal soap after using and rinsing off  the CHG Soap .                9.  Pat yourself dry with a clean towel.             10.  Wear clean pajamas.             11.  Place clean sheets on your bed the night of your first shower and do not  sleep with pets.  Day of Surgery : Do not apply any lotions/deodorants the morning of surgery.  Please wear clean clothes to the hospital/surgery center.  FAILURE TO FOLLOW THESE INSTRUCTIONS MAY RESULT IN THE CANCELLATION OF YOUR SURGERY    PATIENT SIGNATURE_________________________________   Document Released: 07/26/2006 Document Revised: 06/07/2011 Document Reviewed: 09/26/2006 Exit[employer] Patient Information 2014 Exit[employer], [place].   ________________________________________________________________________

## 2013-09-10 ENCOUNTER — Encounter (HOSPITAL_COMMUNITY)
Admission: RE | Admit: 2013-09-10 | Discharge: 2013-09-10 | Disposition: A | Discharge status: Home / Self Care | Payer: BC Managed Care – PPO | Source: Ambulatory Visit | Attending: Surgery | Admitting: Surgery

## 2013-09-10 ENCOUNTER — Encounter (HOSPITAL_COMMUNITY): Payer: Self-pay

## 2013-09-10 ENCOUNTER — Ambulatory Visit (HOSPITAL_COMMUNITY)
Admission: RE | Admit: 2013-09-10 | Discharge: 2013-09-10 | Disposition: A | Discharge status: Home / Self Care | Payer: BC Managed Care – PPO | Source: Ambulatory Visit | Attending: Anesthesiology | Admitting: Anesthesiology

## 2013-09-10 DIAGNOSIS — I10 Essential (primary) hypertension: Secondary | ICD-10-CM | POA: Insufficient documentation

## 2013-09-10 DIAGNOSIS — Z01812 Encounter for preprocedural laboratory examination: Secondary | ICD-10-CM | POA: Insufficient documentation

## 2013-09-10 DIAGNOSIS — Z01818 Encounter for other preprocedural examination: Secondary | ICD-10-CM | POA: Insufficient documentation

## 2013-09-10 HISTORY — DX: Other specified cough: R05.8

## 2013-09-10 HISTORY — DX: Depression, unspecified: F32.A

## 2013-09-10 HISTORY — DX: Cough: R05

## 2013-09-10 HISTORY — DX: Headache: R51

## 2013-09-10 HISTORY — DX: Anxiety disorder, unspecified: F41.9

## 2013-09-10 HISTORY — DX: Major depressive disorder, single episode, unspecified: F32.9

## 2013-09-10 HISTORY — DX: Other complications of anesthesia, initial encounter: T88.59XA

## 2013-09-10 HISTORY — DX: Adverse effect of unspecified anesthetic, initial encounter: T41.45XA

## 2013-09-10 LAB — COMPREHENSIVE METABOLIC PANEL
ALBUMIN: 3.9 g/dL (ref 3.5–5.2)
ALT: 26 U/L (ref 0–35)
AST: 24 U/L (ref 0–37)
Alkaline Phosphatase: 110 U/L (ref 39–117)
BUN: 9 mg/dL (ref 6–23)
CO2: 24 mEq/L (ref 19–32)
CREATININE: 0.69 mg/dL (ref 0.50–1.10)
Calcium: 9.6 mg/dL (ref 8.4–10.5)
Chloride: 102 mEq/L (ref 96–112)
GFR calc Af Amer: >90 mL/min (ref >90)
GFR calc non Af Amer: >90 mL/min (ref >90)
Glucose, Bld: 90 mg/dL (ref 70–99)
POTASSIUM: 4.1 meq/L (ref 3.7–5.3)
Sodium: 138 mEq/L (ref 137–147)
TOTAL PROTEIN: 7.7 g/dL (ref 6.0–8.3)
Total Bilirubin: 0.6 mg/dL (ref 0.3–1.2)

## 2013-09-10 LAB — CBC WITH DIFFERENTIAL/PLATELET
BASOS PCT: 1 % (ref 0–1)
Basophils Absolute: 0 10*3/uL (ref 0.0–0.1)
EOS ABS: 0.1 10*3/uL (ref 0.0–0.7)
Eosinophils Relative: 2 % (ref 0–5)
HCT: 39.8 % (ref 36.0–46.0)
HEMOGLOBIN: 13 g/dL (ref 12.0–15.0)
Lymphocytes Relative: 31 % (ref 12–46)
Lymphs Abs: 2 10*3/uL (ref 0.7–4.0)
MCH: 28.6 pg (ref 26.0–34.0)
MCHC: 32.7 g/dL (ref 30.0–36.0)
MCV: 87.7 fL (ref 78.0–100.0)
Monocytes Absolute: 0.3 10*3/uL (ref 0.1–1.0)
Monocytes Relative: 5 % (ref 3–12)
NEUTROS PCT: 61 % (ref 43–77)
Neutro Abs: 4.1 10*3/uL (ref 1.7–7.7)
Platelets: 344 10*3/uL (ref 150–400)
RBC: 4.54 MIL/uL (ref 3.87–5.11)
RDW: 12 % (ref 11.5–15.5)
WBC: 6.7 10*3/uL (ref 4.0–10.5)

## 2013-09-13 ENCOUNTER — Encounter (INDEPENDENT_AMBULATORY_CARE_PROVIDER_SITE_OTHER): Payer: Self-pay | Admitting: Surgery

## 2013-09-13 ENCOUNTER — Ambulatory Visit (INDEPENDENT_AMBULATORY_CARE_PROVIDER_SITE_OTHER): Payer: BC Managed Care – PPO | Admitting: Surgery

## 2013-09-13 NOTE — Progress Notes (Signed)
Re:   Christine NeighborsKelly Helming DOB:   1980/10/18 MRN:   [id number]  ASSESSMENT AND PLAN: 1.  Morbid obesity  Initial weight - 276, BMI 42.6  Per the 1991 NIH Consensus Statement, the patient is a candidate for bariatric surgery.  The patient attended our initial information session and reviewed the types of bariatric surgery.    The patient is interested in the Sleeve Gastrectomy.  I discussed with the patient the indications and risks of bariatric surgery.  The potential risks of surgery include, but are not limited to, bleeding, infection, leak from the bowel, DVT and PE, open surgery, long term nutrition consequences, and death.  The patient understands the importance of compliance and long term follow-up with our group after surgery.  She understands that it is the newest of our weight loss operations and that we have the least experience with the surgery. I have started doing this surgery, but do not have large numbers. The preliminary results and risks seem to be between the lap band and the gastric bypass.  She also has a small hiatal hernia on UGI.  We will check for this at the time of surgery.  I told [facility], I will try to repair it.  This can add time and risk to the surgery.  I wrote her a prescription for oxycodone elixir 5mg /5cc), 200cc.  Also, we talked about her being out of work for 6 weeks.  2.  HTN 3.  OSA   She does use her CPAP because of the mask.  She is worried about waking up with the mask on her face. 4.  On birth control pills  Chief Complaint  Patient presents with  . Bariatric Pre-op   REFERRING PHYSICIAN: Astrid DivineGRIFFIN,ELAINE COLLINS, MD  HISTORY OF PRESENT ILLNESS: Christine NeighborsKelly Shepard is a 33 y.o. (DOB: 1980/10/18)  white  female whose primary care physician is Astrid DivineGRIFFIN,ELAINE COLLINS, MD and comes to me today for her pre op visit. I asked her to bring her husband this visit, but she came by herself.  She has lost weight since her last visit.  She is excited  about the surgery.  I reviewed the surgery with her.  I reviewed the risks.  She had a friend whose mother had back surgery and got an infection, so she is worried about an infection.  I reviewed with her what we do to prevent infections.  But for us, an intraabdominal infection would make us worry about a leak.  UGI - 07/04/2013 - small HH Dr. Cyndia SkeetersLurey - 08/12/2103  Weight history: Patient says that she has been overweight since age 89. She has tried multiple diets during that time. She went to the information session by Dr. Ovidio Kinavid Kyleen Villatoro. She has a friend of a friend who had a sleeve gastrectomy. She knows patient had a gastric bypass. But the gastric bypass scares her.  She has tried Weight Watchers x2, low calorie diet, [place] Diet, and multiple other diets. She tried Slim fast and phentermine. She had a mall success with about a 20 pound weight loss on Weight Watchers one time, but nothing sustained.  Note her husband does most of the cooking at home.  But she is already trying to change what they have in their home.    Past Medical History  Diagnosis Date  . Hiatal hernia   . Complication of anesthesia     feels panic with face mask  . Hypertension     previously on  BP med - none currently  . Recurrent dry cough   . Headache(784.0)     hx migraines  . Sleep apnea     unable to tolerate mask   . Depression   . Anxiety     xanax prn      Past Surgical History  Procedure Laterality Date  . Breath tek h pylori N/A 07/20/2013    Procedure: BREATH TEK H PYLORI;  Surgeon: Meryl Hubers H Nakari Bracknell, MD;  Location: [facility] ENDOSCOPY;  Service: General;  Laterality: N/A;  . Appendectomy    . Cholecystectomy        Current Outpatient Prescriptions  Medication Sig Dispense Refill  . ALPRAZolam (XANAX) 0.25 MG tablet Take 0.25 mg by mouth daily as needed.       . NASCOBAL 500 MCG/0.1ML SOLN       . AMETHYST 90-20 MCG tablet Take 1 tablet by mouth daily.       . ibuprofen (ADVIL,MOTRIN) 200 MG tablet Take  600 mg by mouth every 6 (six) hours as needed for mild pain or moderate pain.       No current facility-administered medications for this visit.     No Known Allergies  REVIEW OF SYSTEMS: Skin:  No history of rash.  No history of abnormal moles. Infection:  No history of hepatitis or HIV.  No history of MRSA. Neurologic:  No history of stroke.  No history of seizure.  No history of headaches. Cardiac:  HTN x 1 years.  No history of seeing a cardiologist. Pulmonary:  Has OSA since around 2008.  But cannot wear CPAP.  She actually thinks it may be better, though now her husband's snoring bothers her.  Endocrine:  No diabetes. No thyroid disease. Gastrointestinal:  No history of stomach disease.  No history of liver disease.  Dr. Cliford Sequeira did lap chole 11/04/2003.  She had CBD stones - Dr. S. Ganem did ERCP - 8.11/2013 No history of pancreas disease.  No history of colon disease.  Dr. Latisha Lasch did lap appendectomy 03/16/2003. Urologic:  No history of kidney stones.  No history of bladder infections. Musculoskeletal:  No history of joint or back disease. Hematologic:  No bleeding disorder.  No history of anemia.  Not anticoagulated. Psycho-social:  The patient is oriented.   The patient has no obvious psychologic or social impairment to understanding our conversation and plan.  SOCIAL and FAMILY HISTORY: Married. Works as director of graduate school admissions at [employer] Has 2 boys:  Ages 4 and 10.  PHYSICAL EXAM: BP 122/80  Pulse 80  Temp(Src) 98.1 F (36.7 C)  Resp 14  Ht 5' 8" (1.727 m)  Wt 264 lb 12.8 oz (120.112 kg)  BMI 40.27 kg/m2  LMP 09/04/2013  General: WN obese WF who is alert and generally healthy appearing.  HEENT: Normal. Pupils equal. Neck: Supple. No mass.  No thyroid mass. Lymph Nodes:  No supraclavicular or cervical nodes. Lungs: Clear to auscultation and symmetric breath sounds. Heart:  RRR. No murmur or rub. Abdomen: Soft. No mass. No tenderness. No hernia. Normal  bowel sounds.  Umbilical scar.  She is about half pear and half apple.  She asked about going through her umbilicus again - but I told her that I access her abdomen a different way for this surgery. Rectal: Not done. Extremities:  Good strength and ROM  in upper and lower extremities. Neurologic:  Grossly intact to motor and sensory function. Psychiatric: Has normal mood and affect. Behavior is normal.     DATA REVIEWED: Epic notes  Alphonsa Overall, MD,  [facility], [address]  [place], [place]    [place] Phone:  [phone] FAX:  [phone]

## 2013-09-18 ENCOUNTER — Encounter (HOSPITAL_COMMUNITY): Payer: Self-pay | Admitting: *Deleted

## 2013-09-18 ENCOUNTER — Encounter (HOSPITAL_COMMUNITY)
Admission: RE | Disposition: A | Discharge status: Home / Self Care | Payer: Self-pay | Source: Ambulatory Visit | Attending: Surgery

## 2013-09-18 ENCOUNTER — Inpatient Hospital Stay (HOSPITAL_COMMUNITY): Payer: BC Managed Care – PPO | Admitting: Anesthesiology

## 2013-09-18 ENCOUNTER — Encounter (HOSPITAL_COMMUNITY): Payer: BC Managed Care – PPO | Admitting: Anesthesiology

## 2013-09-18 ENCOUNTER — Inpatient Hospital Stay (HOSPITAL_COMMUNITY)
Admission: RE | Admit: 2013-09-18 | Discharge: 2013-09-20 | DRG: 621 | Disposition: A | Discharge status: Home / Self Care | Payer: BC Managed Care – PPO | Source: Ambulatory Visit | Attending: Surgery | Admitting: Surgery

## 2013-09-18 DIAGNOSIS — F329 Major depressive disorder, single episode, unspecified: Secondary | ICD-10-CM | POA: Diagnosis present

## 2013-09-18 DIAGNOSIS — F411 Generalized anxiety disorder: Secondary | ICD-10-CM | POA: Diagnosis present

## 2013-09-18 DIAGNOSIS — K449 Diaphragmatic hernia without obstruction or gangrene: Secondary | ICD-10-CM

## 2013-09-18 DIAGNOSIS — I10 Essential (primary) hypertension: Secondary | ICD-10-CM | POA: Diagnosis present

## 2013-09-18 DIAGNOSIS — F3289 Other specified depressive episodes: Secondary | ICD-10-CM | POA: Diagnosis present

## 2013-09-18 DIAGNOSIS — G4733 Obstructive sleep apnea (adult) (pediatric): Secondary | ICD-10-CM | POA: Diagnosis present

## 2013-09-18 DIAGNOSIS — Z6841 Body Mass Index (BMI) 40.0 and over, adult: Secondary | ICD-10-CM

## 2013-09-18 HISTORY — PX: LAPAROSCOPIC GASTRIC SLEEVE RESECTION: SHX5895

## 2013-09-18 LAB — HEMOGLOBIN AND HEMATOCRIT, BLOOD
HCT: 39.2 % (ref 36.0–46.0)
Hemoglobin: 13.1 g/dL (ref 12.0–15.0)

## 2013-09-18 LAB — PREGNANCY, URINE: Preg Test, Ur: NEGATIVE

## 2013-09-18 SURGERY — GASTRECTOMY, SLEEVE, LAPAROSCOPIC
Anesthesia: General

## 2013-09-18 MED ORDER — CHLORHEXIDINE GLUCONATE 4 % EX LIQD: 60.0000 mL | Freq: Once | CUTANEOUS | Status: DC

## 2013-09-18 MED ORDER — HEPARIN SODIUM (PORCINE) 5000 UNIT/ML IJ SOLN
5000.0000 [IU] | INTRAMUSCULAR | Status: AC
  Administered 2013-09-18: 5000 [IU] via SUBCUTANEOUS
  Filled 2013-09-18: qty 1

## 2013-09-18 MED ORDER — BUPIVACAINE-EPINEPHRINE (PF) 0.25% -1:200000 IJ SOLN
INTRAMUSCULAR | Status: AC
  Filled 2013-09-18: qty 30

## 2013-09-18 MED ORDER — SUCCINYLCHOLINE CHLORIDE 20 MG/ML IJ SOLN
INTRAMUSCULAR | Status: DC | PRN
  Administered 2013-09-18: 140 mg via INTRAVENOUS

## 2013-09-18 MED ORDER — PROPOFOL 10 MG/ML IV BOLUS
INTRAVENOUS | Status: AC
  Filled 2013-09-18: qty 20

## 2013-09-18 MED ORDER — LACTATED RINGERS IR SOLN
Status: DC | PRN
  Administered 2013-09-18: 3000 mL

## 2013-09-18 MED ORDER — FENTANYL CITRATE 0.05 MG/ML IJ SOLN
INTRAMUSCULAR | Status: AC
  Filled 2013-09-18: qty 5

## 2013-09-18 MED ORDER — MORPHINE SULFATE 2 MG/ML IJ SOLN
2.0000 mg | INTRAMUSCULAR | Status: DC | PRN
  Administered 2013-09-18: 4 mg via INTRAVENOUS
  Administered 2013-09-18 (×2): 2 mg via INTRAVENOUS
  Administered 2013-09-18: 4 mg via INTRAVENOUS
  Administered 2013-09-18 – 2013-09-19 (×2): 2 mg via INTRAVENOUS
  Administered 2013-09-19: 4 mg via INTRAVENOUS
  Filled 2013-09-18: qty 1
  Filled 2013-09-18 (×2): qty 2

## 2013-09-18 MED ORDER — NEOSTIGMINE METHYLSULFATE 10 MG/10ML IV SOLN
INTRAVENOUS | Status: DC | PRN
  Administered 2013-09-18: 1 mg via INTRAVENOUS

## 2013-09-18 MED ORDER — ONDANSETRON HCL 4 MG/2ML IJ SOLN
4.0000 mg | INTRAMUSCULAR | Status: DC | PRN
  Administered 2013-09-18 – 2013-09-19 (×3): 4 mg via INTRAVENOUS
  Filled 2013-09-18 (×2): qty 2

## 2013-09-18 MED ORDER — GLYCOPYRROLATE 0.2 MG/ML IJ SOLN
INTRAMUSCULAR | Status: AC
Start: 2013-09-18 — End: 2013-09-18
  Filled 2013-09-18: qty 3

## 2013-09-18 MED ORDER — PROPOFOL 10 MG/ML IV BOLUS
INTRAVENOUS | Status: DC | PRN
  Administered 2013-09-18 (×3): 50 mg via INTRAVENOUS
  Administered 2013-09-18: 200 mg via INTRAVENOUS
  Administered 2013-09-18 (×3): 50 mg via INTRAVENOUS

## 2013-09-18 MED ORDER — DEXAMETHASONE SODIUM PHOSPHATE 10 MG/ML IJ SOLN
INTRAMUSCULAR | Status: DC | PRN
  Administered 2013-09-18: 10 mg via INTRAVENOUS

## 2013-09-18 MED ORDER — BUPIVACAINE-EPINEPHRINE 0.25% -1:200000 IJ SOLN
INTRAMUSCULAR | Status: DC | PRN
  Administered 2013-09-18: 24 mL

## 2013-09-18 MED ORDER — PROMETHAZINE HCL 25 MG/ML IJ SOLN
INTRAMUSCULAR | Status: AC
  Administered 2013-09-18: 6.25 mg
  Filled 2013-09-18: qty 1

## 2013-09-18 MED ORDER — HYDROMORPHONE HCL PF 1 MG/ML IJ SOLN
INTRAMUSCULAR | Status: AC
  Filled 2013-09-18: qty 1

## 2013-09-18 MED ORDER — UNJURY CHICKEN SOUP POWDER: 2.0000 [oz_av] | Freq: Four times a day (QID) | ORAL | Status: DC

## 2013-09-18 MED ORDER — 0.9 % SODIUM CHLORIDE (POUR BTL) OPTIME
TOPICAL | Status: DC | PRN
  Administered 2013-09-18: 1000 mL

## 2013-09-18 MED ORDER — ACETAMINOPHEN 10 MG/ML IV SOLN
1000.0000 mg | Freq: Four times a day (QID) | INTRAVENOUS | Status: AC
  Administered 2013-09-18 – 2013-09-19 (×4): 1000 mg via INTRAVENOUS
  Filled 2013-09-18 (×6): qty 100

## 2013-09-18 MED ORDER — ACETAMINOPHEN 160 MG/5ML PO SOLN
650.0000 mg | ORAL | Status: DC | PRN
  Administered 2013-09-19 – 2013-09-20 (×2): 650 mg via ORAL
  Filled 2013-09-18 (×2): qty 20.3

## 2013-09-18 MED ORDER — LIDOCAINE HCL (CARDIAC) 20 MG/ML IV SOLN
INTRAVENOUS | Status: DC | PRN
  Administered 2013-09-18: 75 mg via INTRAVENOUS

## 2013-09-18 MED ORDER — DEXAMETHASONE SODIUM PHOSPHATE 10 MG/ML IJ SOLN
INTRAMUSCULAR | Status: AC
  Filled 2013-09-18: qty 1

## 2013-09-18 MED ORDER — GLYCOPYRROLATE 0.2 MG/ML IJ SOLN
INTRAMUSCULAR | Status: DC | PRN
  Administered 2013-09-18: 0.2 mg via INTRAVENOUS

## 2013-09-18 MED ORDER — CISATRACURIUM BESYLATE (PF) 10 MG/5ML IV SOLN
INTRAVENOUS | Status: DC | PRN
  Administered 2013-09-18: 6 mg via INTRAVENOUS
  Administered 2013-09-18: 10 mg via INTRAVENOUS

## 2013-09-18 MED ORDER — HYDROMORPHONE HCL PF 1 MG/ML IJ SOLN
0.2500 mg | INTRAMUSCULAR | Status: DC | PRN
  Administered 2013-09-18 (×2): 0.5 mg via INTRAVENOUS

## 2013-09-18 MED ORDER — POTASSIUM CHLORIDE IN NACL 20-0.45 MEQ/L-% IV SOLN
INTRAVENOUS | Status: DC
  Administered 2013-09-18 – 2013-09-20 (×6): via INTRAVENOUS
  Filled 2013-09-18 (×8): qty 1000

## 2013-09-18 MED ORDER — LACTATED RINGERS IV SOLN: INTRAVENOUS | Status: DC

## 2013-09-18 MED ORDER — LIDOCAINE HCL (CARDIAC) 20 MG/ML IV SOLN
INTRAVENOUS | Status: AC
  Filled 2013-09-18: qty 5

## 2013-09-18 MED ORDER — ONDANSETRON HCL 4 MG/2ML IJ SOLN
INTRAMUSCULAR | Status: AC
  Filled 2013-09-18: qty 2

## 2013-09-18 MED ORDER — UNJURY CHOCOLATE CLASSIC POWDER
2.0000 [oz_av] | Freq: Four times a day (QID) | ORAL | Status: DC
  Administered 2013-09-20: 2 [oz_av] via ORAL

## 2013-09-18 MED ORDER — MIDAZOLAM HCL 2 MG/2ML IJ SOLN
INTRAMUSCULAR | Status: AC
  Filled 2013-09-18: qty 2

## 2013-09-18 MED ORDER — PROMETHAZINE HCL 25 MG/ML IJ SOLN: 6.2500 mg | INTRAMUSCULAR | Status: DC | PRN

## 2013-09-18 MED ORDER — NEOSTIGMINE METHYLSULFATE 10 MG/10ML IV SOLN
INTRAVENOUS | Status: AC
  Filled 2013-09-18: qty 1

## 2013-09-18 MED ORDER — LACTATED RINGERS IV SOLN
INTRAVENOUS | Status: DC | PRN
  Administered 2013-09-18: 12:00:00 via INTRAVENOUS

## 2013-09-18 MED ORDER — UNJURY VANILLA POWDER: 2.0000 [oz_av] | Freq: Four times a day (QID) | ORAL | Status: DC

## 2013-09-18 MED ORDER — MIDAZOLAM HCL 5 MG/5ML IJ SOLN
INTRAMUSCULAR | Status: DC | PRN
  Administered 2013-09-18 (×2): 1 mg via INTRAVENOUS

## 2013-09-18 MED ORDER — FENTANYL CITRATE 0.05 MG/ML IJ SOLN
INTRAMUSCULAR | Status: DC | PRN
  Administered 2013-09-18: 50 ug via INTRAVENOUS
  Administered 2013-09-18: 100 ug via INTRAVENOUS
  Administered 2013-09-18: 250 ug via INTRAVENOUS
  Administered 2013-09-18: 100 ug via INTRAVENOUS

## 2013-09-18 MED ORDER — ACETAMINOPHEN 160 MG/5ML PO SOLN: 325.0000 mg | ORAL | Status: DC | PRN

## 2013-09-18 MED ORDER — LACTATED RINGERS IV SOLN
INTRAVENOUS | Status: DC
  Administered 2013-09-18: 1000 mL via INTRAVENOUS

## 2013-09-18 MED ORDER — TISSEEL VH 10 ML EX KIT
PACK | CUTANEOUS | Status: AC
  Filled 2013-09-18: qty 2

## 2013-09-18 MED ORDER — DEXTROSE 5 % IV SOLN
2.0000 g | Freq: Three times a day (TID) | INTRAVENOUS | Status: AC
  Administered 2013-09-18: 2 g via INTRAVENOUS
  Filled 2013-09-18: qty 2

## 2013-09-18 MED ORDER — TISSEEL VH 10 ML EX KIT
PACK | CUTANEOUS | Status: DC | PRN
  Administered 2013-09-18: 2

## 2013-09-18 MED ORDER — FENTANYL CITRATE 0.05 MG/ML IJ SOLN
INTRAMUSCULAR | Status: AC
  Filled 2013-09-18: qty 2

## 2013-09-18 MED ORDER — OXYCODONE HCL 5 MG/5ML PO SOLN
5.0000 mg | ORAL | Status: DC | PRN
  Administered 2013-09-19: 10 mg via ORAL
  Administered 2013-09-19 – 2013-09-20 (×2): 5 mg via ORAL
  Administered 2013-09-20 (×2): 10 mg via ORAL
  Filled 2013-09-18: qty 5
  Filled 2013-09-18: qty 10
  Filled 2013-09-18: qty 5
  Filled 2013-09-18 (×2): qty 10

## 2013-09-18 MED ORDER — CISATRACURIUM BESYLATE 20 MG/10ML IV SOLN
INTRAVENOUS | Status: AC
Start: 2013-09-18 — End: 2013-09-18
  Filled 2013-09-18: qty 10

## 2013-09-18 MED ORDER — DEXTROSE 5 % IV SOLN
2.0000 g | INTRAVENOUS | Status: AC
  Administered 2013-09-18: 2 g via INTRAVENOUS

## 2013-09-18 MED ORDER — ACETAMINOPHEN 10 MG/ML IV SOLN
1000.0000 mg | Freq: Once | INTRAVENOUS | Status: AC
  Administered 2013-09-18: 1000 mg via INTRAVENOUS
  Filled 2013-09-18: qty 100

## 2013-09-18 MED ORDER — ONDANSETRON HCL 4 MG/2ML IJ SOLN
INTRAMUSCULAR | Status: DC | PRN
  Administered 2013-09-18: 4 mg via INTRAVENOUS

## 2013-09-18 MED ORDER — DEXTROSE 5 % IV SOLN
INTRAVENOUS | Status: AC
  Filled 2013-09-18: qty 2

## 2013-09-18 MED ORDER — HEPARIN SODIUM (PORCINE) 5000 UNIT/ML IJ SOLN
5000.0000 [IU] | Freq: Three times a day (TID) | INTRAMUSCULAR | Status: DC
  Administered 2013-09-18 – 2013-09-20 (×5): 5000 [IU] via SUBCUTANEOUS
  Filled 2013-09-18 (×8): qty 1

## 2013-09-18 SURGICAL SUPPLY — 65 items
ADH SKN CLS APL DERMABOND .7 (GAUZE/BANDAGES/DRESSINGS) ×1
APL SRG 32X5 SNPLK LF DISP (MISCELLANEOUS) ×2
APPLICATOR COTTON TIP 6IN STRL (MISCELLANEOUS) IMPLANT
APPLIER CLIP ROT 10 11.4 M/L (STAPLE) ×3
APPLIER CLIP ROT 13.4 12 LRG (CLIP)
APR CLP LRG 13.4X12 ROT 20 MLT (CLIP)
APR CLP MED LRG 11.4X10 (STAPLE) ×1
BAG SPEC RTRVL LRG 6X4 10 (ENDOMECHANICALS)
BLADE SURG SZ11 CARB STEEL (BLADE) ×3 IMPLANT
CABLE HIGH FREQUENCY MONO STRZ (ELECTRODE) IMPLANT
CANISTER SUCTION 2500CC (MISCELLANEOUS) ×3 IMPLANT
CHLORAPREP W/TINT 26ML (MISCELLANEOUS) ×5 IMPLANT
CLIP APPLIE ROT 10 11.4 M/L (STAPLE) IMPLANT
CLIP APPLIE ROT 13.4 12 LRG (CLIP) IMPLANT
DERMABOND ADVANCED (GAUZE/BANDAGES/DRESSINGS) ×2
DERMABOND ADVANCED .7 DNX12 (GAUZE/BANDAGES/DRESSINGS) ×1 IMPLANT
DEVICE SUT TI-KNOT TK 5X26 (MISCELLANEOUS) ×1 IMPLANT
DEVICE SUTURE ENDOST 10MM (ENDOMECHANICALS) ×2 IMPLANT
DEVICE TI KNOT TK5 (MISCELLANEOUS) ×1
DEVICE TROCAR PUNCTURE CLOSURE (ENDOMECHANICALS) ×2 IMPLANT
DRAPE CAMERA CLOSED 9X96 (DRAPES) ×3 IMPLANT
DRAPE UTILITY XL STRL (DRAPES) ×6 IMPLANT
ELECT REM PT RETURN 9FT ADLT (ELECTROSURGICAL) ×3
ELECTRODE REM PT RTRN 9FT ADLT (ELECTROSURGICAL) ×1 IMPLANT
EVACUATOR SMOKE 8.L (FILTER) ×2 IMPLANT
GAUZE SPONGE 4X4 12PLY STRL (GAUZE/BANDAGES/DRESSINGS) IMPLANT
GLOVE SURG SIGNA 7.5 PF LTX (GLOVE) ×3 IMPLANT
GOWN SPEC L4 XLG W/TWL (GOWN DISPOSABLE) ×1 IMPLANT
GOWN STRL REUS W/ TWL XL LVL3 (GOWN DISPOSABLE) ×3 IMPLANT
GOWN STRL REUS W/TWL XL LVL3 (GOWN DISPOSABLE) ×18
HOLDER FOLEY CATH W/STRAP (MISCELLANEOUS) ×2 IMPLANT
HOVERMATT SINGLE USE (MISCELLANEOUS) ×3 IMPLANT
KIT BASIN OR (CUSTOM PROCEDURE TRAY) ×3 IMPLANT
NDL SPNL 22GX3.5 QUINCKE BK (NEEDLE) ×1 IMPLANT
NEEDLE SPNL 22GX3.5 QUINCKE BK (NEEDLE) ×3 IMPLANT
PACK UNIVERSAL I (CUSTOM PROCEDURE TRAY) ×3 IMPLANT
PENCIL BUTTON HOLSTER BLD 10FT (ELECTRODE) ×3 IMPLANT
POUCH SPECIMEN RETRIEVAL 10MM (ENDOMECHANICALS) IMPLANT
RELOAD BLUE (STAPLE) ×6 IMPLANT
RELOAD ENDO STITCH (ENDOMECHANICALS) ×3 IMPLANT
RELOAD GOLD (STAPLE) ×3 IMPLANT
RELOAD GREEN (STAPLE) ×5 IMPLANT
RELOAD SUT TRIPLE-STITCH 2-0 (ENDOMECHANICALS) IMPLANT
SCISSORS LAP 5X35 DISP (ENDOMECHANICALS) ×2 IMPLANT
SEALANT SURGICAL APPL DUAL CAN (MISCELLANEOUS) ×5 IMPLANT
SET IRRIG TUBING LAPAROSCOPIC (IRRIGATION / IRRIGATOR) ×3 IMPLANT
SHEARS CURVED HARMONIC AC 45CM (MISCELLANEOUS) ×3 IMPLANT
SLEEVE ADV FIXATION 5X100MM (TROCAR) ×3 IMPLANT
SLEEVE GASTRECTOMY 36FR VISIGI (MISCELLANEOUS) ×3 IMPLANT
SOLUTION ANTI FOG 6CC (MISCELLANEOUS) ×3 IMPLANT
SPONGE LAP 18X18 X RAY DECT (DISPOSABLE) ×3 IMPLANT
STAPLE ECHEON FLEX 60 POW ENDO (STAPLE) ×3 IMPLANT
SUT ETHILON 2 0 PS N (SUTURE) IMPLANT
SUT MNCRL AB 4-0 PS2 18 (SUTURE) ×3 IMPLANT
SUT VICRYL 0 UR6 27IN ABS (SUTURE) ×2 IMPLANT
SYRINGE 20CC LL (MISCELLANEOUS) ×3 IMPLANT
TOWEL OR NON WOVEN STRL DISP B (DISPOSABLE) ×3 IMPLANT
TRAY FOLEY CATH 14FRSI W/METER (CATHETERS) ×3 IMPLANT
TROCAR ADV FIXATION 5X100MM (TROCAR) ×3 IMPLANT
TROCAR BLADELESS 15MM (ENDOMECHANICALS) ×3 IMPLANT
TROCAR XCEL NON-BLD 5MMX100MML (ENDOMECHANICALS) ×3 IMPLANT
TUBING CONNECTING 10 (TUBING) ×2 IMPLANT
TUBING CONNECTING 10' (TUBING) ×1
TUBING ENDO SMARTCAP PENTAX (MISCELLANEOUS) ×3 IMPLANT
TUBING FILTER THERMOFLATOR (ELECTROSURGICAL) ×3 IMPLANT

## 2013-09-18 NOTE — Anesthesia Procedure Notes (Signed)
Procedure Name: Intubation Date/Time: 09/18/2013 12:24 PM Performed by: Edison Shepard, Christine Schweppe E Pre-anesthesia Checklist: Patient identified, Timeout performed, Emergency Drugs available, Suction available and Patient being monitored Patient Re-evaluated:Patient Re-evaluated prior to inductionOxygen Delivery Method: Circle system utilized Preoxygenation: Pre-oxygenation with 100% oxygen Intubation Type: IV induction Ventilation: Mask ventilation without difficulty Laryngoscope Size: Mac and 4 Grade View: Grade III Tube type: Oral Tube size: 7.5 mm Number of attempts: 1 Airway Equipment and Method: Stylet Placement Confirmation: ETT inserted through vocal cords under direct vision,  positive ETCO2 and breath sounds checked- equal and bilateral (cords vis with cricoid press/head lift) Secured at: 21 cm Tube secured with: Tape Dental Injury: Teeth and Oropharynx as per pre-operative assessment  Difficulty Due To: Difficulty was anticipated, Difficult Airway- due to large tongue, Difficult Airway- due to reduced neck mobility, Difficult Airway- due to dentition, Difficult Airway- due to limited oral opening and Difficult Airway- due to anterior larynx Future Recommendations: Recommend- induction with short-acting agent, and alternative techniques readily available

## 2013-09-18 NOTE — H&P (View-Only) (Signed)
Re:   [name] DOB:   1980/10/18 MRN:   [id number]  ASSESSMENT AND PLAN: 1.  Morbid obesity  Initial weight - 276, BMI 42.6  Per the 1991 NIH Consensus Statement, the patient is a candidate for bariatric surgery.  The patient attended our initial information session and reviewed the types of bariatric surgery.    The patient is interested in the Sleeve Gastrectomy.  I discussed with the patient the indications and risks of bariatric surgery.  The potential risks of surgery include, but are not limited to, bleeding, infection, leak from the bowel, DVT and PE, open surgery, long term nutrition consequences, and death.  The patient understands the importance of compliance and long term follow-up with our group after surgery.  She understands that it is the newest of our weight loss operations and that we have the least experience with the surgery. I have started doing this surgery, but do not have large numbers. The preliminary results and risks seem to be between the lap band and the gastric bypass.  She also has a small hiatal hernia on UGI.  We will check for this at the time of surgery.  I told [facility], I will try to repair it.  This can add time and risk to the surgery.  I wrote her a prescription for oxycodone elixir 5mg /5cc), 200cc.  Also, we talked about her being out of work for 6 weeks.  2.  HTN 3.  OSA   She does use her CPAP because of the mask.  She is worried about waking up with the mask on her face. 4.  On birth control pills  Chief Complaint  Patient presents with  . Bariatric Pre-op   REFERRING PHYSICIAN: Astrid DivineGRIFFIN,ELAINE COLLINS, MD  HISTORY OF PRESENT ILLNESS: [name] is a 33 y.o. (DOB: 1980/10/18)  white  female whose primary care physician is Christine DivineGRIFFIN,ELAINE COLLINS, MD and comes to me today for her pre op visit. I asked her to bring her husband this visit, but she came by herself.  She has lost weight since her last visit.  She is excited  about the surgery.  I reviewed the surgery with her.  I reviewed the risks.  She had a friend whose mother had back surgery and got an infection, so she is worried about an infection.  I reviewed with her what we do to prevent infections.  But for us, an intraabdominal infection would make us worry about a leak.  UGI - 07/04/2013 - small HH Dr. Cyndia SkeetersLurey - 08/12/2103  Weight history: Patient says that she has been overweight since age 33. She has tried multiple diets during that time. She went to the information session by Dr. Ovidio Kinavid Newman. She has a friend of a friend who had a sleeve gastrectomy. She knows patient had a gastric bypass. But the gastric bypass scares her.  She has tried Weight Watchers x2, low calorie diet, LA Diet, and multiple other diets. She tried Slim fast and phentermine. She had a mall success with about a 20 pound weight loss on Weight Watchers one time, but nothing sustained.  Note her husband does most of the cooking at home.  But she is already trying to change what they have in their home.    Past Medical History  Diagnosis Date  . Hiatal hernia   . Complication of anesthesia     feels panic with face mask  . Hypertension     previously on  BP med - none currently  . Recurrent dry cough   . Headache(784.0)     hx migraines  . Sleep apnea     unable to tolerate mask   . Depression   . Anxiety     xanax prn      Past Surgical History  Procedure Laterality Date  . Breath tek h pylori N/A 07/20/2013    Procedure: BREATH TEK H PYLORI;  Surgeon: Kandis Cockingavid H Newman, MD;  [facility];  Service: General;  Laterality: N/A;  . Appendectomy    . Cholecystectomy        Current Outpatient Prescriptions  Medication Sig Dispense Refill  . ALPRAZolam (XANAX) 0.25 MG tablet Take 0.25 mg by mouth daily as needed.       Marland Kitchen. NASCOBAL 500 MCG/0.1ML SOLN       . AMETHYST 90-20 MCG tablet Take 1 tablet by mouth daily.       Marland Kitchen. ibuprofen (ADVIL,MOTRIN) 200 MG tablet Take  600 mg by mouth every 6 (six) hours as needed for mild pain or moderate pain.       No current facility-administered medications for this visit.     No Known Allergies  REVIEW OF SYSTEMS: Skin:  No history of rash.  No history of abnormal moles. Infection:  No history of hepatitis or HIV.  No history of MRSA. Neurologic:  No history of stroke.  No history of seizure.  No history of headaches. Cardiac:  HTN x 1 years.  No history of seeing a cardiologist. Pulmonary:  Has OSA since around 2008.  But cannot wear CPAP.  She actually thinks it may be better, though now her husband's snoring bothers her.  Endocrine:  No diabetes. No thyroid disease. Gastrointestinal:  No history of stomach disease.  No history of liver disease.  Dr. Ezzard StandingNewman did lap chole 11/04/2003.  She had CBD stones - Dr. Luan MooreS. Ganem did ERCP - 8.11/2013 No history of pancreas disease.  No history of colon disease.  Dr. Ezzard StandingNewman did lap appendectomy 03/16/2003. Urologic:  No history of kidney stones.  No history of bladder infections. Musculoskeletal:  No history of joint or back disease. Hematologic:  No bleeding disorder.  No history of anemia.  Not anticoagulated. Psycho-social:  The patient is oriented.   The patient has no obvious psychologic or social impairment to understanding our conversation and plan.  SOCIAL and FAMILY HISTORY: Married. Works as Agricultural engineerdirector of graduate school admissions at [employer] Has 2 boys:  Ages 4 and 35.  PHYSICAL EXAM: BP 122/80  Pulse 80  Temp(Src) 98.1 F (36.7 C)  Resp 14  Ht 5\' 8"  (1.727 m)  Wt 264 lb 12.8 oz (120.112 kg)  BMI 40.27 kg/m2  LMP 09/04/2013  General: WN obese WF who is alert and generally healthy appearing.  HEENT: Normal. Pupils equal. Neck: Supple. No mass.  No thyroid mass. Lymph Nodes:  No supraclavicular or cervical nodes. Lungs: Clear to auscultation and symmetric breath sounds. Heart:  RRR. No murmur or rub. Abdomen: Soft. No mass. No tenderness. No hernia. Normal  bowel sounds.  Umbilical scar.  She is about half pear and half apple.  She asked about going through her umbilicus again - but I told her that I access her abdomen a different way for this surgery. Rectal: Not done. Extremities:  Good strength and ROM  in upper and lower extremities. Neurologic:  Grossly intact to motor and sensory function. Psychiatric: Has normal mood and affect. Behavior is normal.  DATA REVIEWED: Epic notes  Alphonsa Overall, MD,  [facility], [address]  [place], [place]    [place] Phone:  [phone] FAX:  [phone]

## 2013-09-18 NOTE — Plan of Care (Signed)
Problem: Phase I Progression Outcomes Goal: Pain controlled with appropriate interventions Outcome: Not Progressing Pt complaining of abdominal pain. PRN and scheduled medications given. Pt encouraged to ambulate in halls.  Goal: Voiding-avoid urinary catheter unless indicated Outcome: Not Progressing Pt DTV 2200.

## 2013-09-18 NOTE — Op Note (Signed)
[name]:   Teniya Leete DOB:   [birth year] MRN:   [id number]  DATE OF PROCEDURE: 09/18/2013                   [facility]  OPERATIVE REPORT  PREOPERATIVE DIAGNOSIS:  Morbid obesity.  POSTOPERATIVE DIAGNOSIS:  Morbid obesity (weight 276, BMI of 42.6).  PROCEDURE:  Laparoscopic Sleeve gastrectomy (intraoperative upper endoscopy by E. Andrey CampanileWilson)  SURGEON:  Sandria Balesavid H. Ezzard StandingNewman, MD  FIRST ASSISTANFredonia Highland:  E. Wilson, MD  ANESTHESIA:  General endotracheal.  Anesthesiologist: Einar PheasantAlexander F Fortune, MD CRNA: Edison PaceJoanne E Gray, CRNA; Elisabeth CaraLacey A Armistead, CRNA  General  ESTIMATED BLOOD LOSS:  Minimal.  LOCAL ANESTHESIA:  30 cc of 1/4% Marcaine  COMPLICATIONS:  None.  INDICATION FOR SURGERY:  Christine NeighborsKelly Dimaggio is a 33 y.o. white  female who sees Astrid DivineGRIFFIN,ELAINE COLLINS, MD as her primary care doctor.  She has completed our preoperative bariatric program and now comes for a laparoscopic Sleeve gastrectomy.  The indications, potential complications of surgery were explained to the patient.  Potential complications of the surgery include, but are not limited to, bleeding, infection, DVT, open surgery, and long-term nutritional consequences.  OPERATIVE NOTE:  [facility] where Ms. Gohman underwent a general endotracheal anesthetic, supervised by Anesthesiologist: Einar PheasantAlexander F Fortune, MD CRNA: Edison PaceJoanne E Gray, CRNA; Elisabeth CaraLacey A Armistead, CRNA.  The patient was given 2 g of cefoxitin at the beginning of the procedure.  A time-out was held and surgical checklist run.  I accessed her abdominal cavity through the left upper quadrant with an 11mm Optiview. I did an abdominal exploration.   Her omentum and bowel were unremarkable. The right and left lobes of the liver unremarkable. Gallbladder was absent. Her stomach was unremarkable.   I tested her for a hiatal hernia.  Even though she had a small dimple anterior to the GE junction, I passed a sizing tube and insufflated both 15 cc and 10 cc of air and the  sizing tube hung up at the hiatus.  I did not think that there is a hiatal hernia that needed to be repaired and turned my attention to the sleeve gastrectomy.  I placed a total of 6 trocars. I placed a 5 mm left lateral trocar, a 5 mm left paramedian trocar, a 12 mm right paramedian torcar, a 5 mm right subcostal trocar that I converted to a 15 mm to extract the stomach (at the end of the case) and 5 mm subxiphoid trocar for the liver retractor.  I started out taking down the greater curvature attachment. I measured approximately 6 cm proximal from the pylorus and I started my dissection taking down the greater curvature of the stomach with the Harmonic Scalpel at that point. I took this dissection around her stomach to the angle of His and the left crus.   After I had mobilized the greater curvature of the stomach, I then passed the 36 French ViSiGi bougie which was used to suck up against the lesser curvature and placed into the antrum. During the staple firing,  I tried to give the ViSiGi a cuff at least about 1 cm from the fired staple line. I tried to avoid narrowing the incisura. I used a total of 7 staple firings.  From antrum to the angle of His I used 2 green, 1 gold and 3 blue Eschelon 60 mm Ethicon staplers.  At each firing of the EndoGIA stapler, I inspected the stomach, anterior wall of the  stomach, and underneath to make sure there was no compromise or impingement on to the ViSiGi bougie.   The staple line seemed linear without any corkscrewing of the stomach. I had to put about 5 clips along the staple line because of small bleeding vessels.  I did not use any reinforcement.   Because I thought we had a good staple line, I then had the ViSiGi was converted to insufflate the pouch. A new stomach pouch was placed under water. There was no bubbling or leak noted.   At this point, Dr. Andrey CampanileWilson broke scrub and passed an upper endoscope down through the esophagus into the stomach pouch.  He saw  the EG junction at 38 cm.   The stomach was tubular. There was no narrowing of the stomach pouch or angulation. We were easily able to pass the endoscope into the antrum and again put air pressure on the staple line. I inflated the upper abdomen with saline. There was no bubbling or evidence of air leak. The mucosa looked viable.   I converted to right subcostal trocar to a 15 trocar and extracted the stomach remnant through this intact and sent this to Pathology. I then placed 10 cc of Tisseel along the new greater curvature staple line, covered the entire staple line with the Tisseel, and aspirated out the saline that I had irrigated because I thought the staple line looked viable and complete. There was no evidence of leak. I did not leave a drain in place. Dr. Andrey CampanileWilson had decompressed the stomach with the endoscopy.   Then, I closed the trocar sites. I placed 2-0 Vicryl sutures at the 15-mm port site in the right upper quadrant. The other port sites seemed smaller not requiring sutures. I closed the skin at each site with a 4-0 Monocryl, painted each wound with Dermabond. The patient was transported to recovery room in good condition. Sponge and needle count were correct at the end of the case.   Ovidio Kinavid Newman, MD, [facility] Pager: [phone] Office phone:  [phone]

## 2013-09-18 NOTE — Interval H&P Note (Signed)
History and Physical Interval Note:  09/18/2013 11:31 AM  Christine NeighborsKelly Galligan  has presented today for surgery, with the diagnosis of morbid obesity  The various methods of treatment have been discussed with the patient and family.  Patient is aware that we will look for a hiatal hernia.  Her husband and mother are here.  Though her husband is going for a job interview today.  After consideration of risks, benefits and other options for treatment, the patient has consented to  Procedure(s): LAPAROSCOPIC GASTRIC SLEEVE RESECTION (N/A) as a surgical intervention .  The patient's history has been reviewed, patient examined, no change in status, stable for surgery.  I have reviewed the patient's chart and labs.  Questions were answered to the patient's satisfaction.     NEWMAN,DAVID H

## 2013-09-18 NOTE — Anesthesia Preprocedure Evaluation (Signed)
Anesthesia Evaluation  Patient identified by MRN, date of birth, ID band Patient awake    Reviewed: Allergy & Precautions, H&P , NPO status , Patient's Chart, lab work & pertinent test results  Airway Mallampati: III TM Distance: >3 FB Neck ROM: Full    Dental  (+) Teeth Intact, Dental Advisory Given   Pulmonary neg pulmonary ROS, sleep apnea ,  breath sounds clear to auscultation  Pulmonary exam normal       Cardiovascular hypertension, Rhythm:Regular Rate:Normal     Neuro/Psych Anxiety Depression Hx anxiety, panic attacks with claustrophobia.   GI/Hepatic Neg liver ROS, hiatal hernia,   Endo/Other  Morbid obesity  Renal/GU negative Renal ROS  negative genitourinary   Musculoskeletal negative musculoskeletal ROS (+)   Abdominal (+) + obese,   Peds negative pediatric ROS (+)  Hematology negative hematology ROS (+)   Anesthesia Other Findings   Reproductive/Obstetrics                           Anesthesia Physical Anesthesia Plan  ASA: III  Anesthesia Plan: General   Post-op Pain Management:    Induction: Intravenous  Airway Management Planned: Oral ETT  Additional Equipment:   Intra-op Plan:   Post-operative Plan: Extubation in OR  Informed Consent: I have reviewed the patients History and Physical, chart, labs and discussed the procedure including the risks, benefits and alternatives for the proposed anesthesia with the patient or authorized representative who has indicated his/her understanding and acceptance.   Dental advisory given  Plan Discussed with: CRNA  Anesthesia Plan Comments:         Anesthesia Quick Evaluation

## 2013-09-18 NOTE — Op Note (Signed)
Christine NeighborsKelly Andrzejewski [id number] [birth year] 09/18/2013  Preoperative diagnosis: morbid obesity  Postoperative diagnosis: Same   Procedure: Esophagogastroduodenoscopy   Surgeon: Mary SellaEric M. Wilson M.D., FACS   Anesthesia: Gen.   Indications for procedure: 33 year old WF undergoing Laparoscopic Gastric Sleeve Resection and an EGD was requested to evaluate the new gastric sleeve.   Description of procedure: After we have completed the sleeve resection, I scrubbed out and obtained the Olympus endoscope. I gently placed endoscope in the patient's oropharynx and gently glided it down the esophagus without any difficulty under direct visualization. Once I was in the gastric sleeve, I insufflated the stomach with air. I was able to cannulate and advanced the scope through the gastric sleeve. I was able to cannulate the duodenum with ease. Dr. Ezzard StandingNewman had placed saline in the upper abdomen. Upon further insufflation of the gastric sleeve there was no evidence of bubbles. GE junction located at 38 cm.  Upon further inspection of the gastric sleeve, the mucosa appeared normal. There is no evidence of any mucosal abnormality. The sleeve was widely patent at the angularis. There was no evidence of bleeding. The gastric sleeve was decompressed. The scope was withdrawn. The patient tolerated this portion of the procedure well. Please see Dr Allene PyoNewman's operative note for details regarding the laparoscopic gastric sleeve resection.   Mary SellaEric M. Andrey CampanileWilson, MD, FACS  General, Bariatric, & Minimally Invasive Surgery  [facility], [place]

## 2013-09-18 NOTE — Transfer of Care (Signed)
Immediate Anesthesia Transfer of Care Note  Patient: [name]  Procedure(s) Performed: Procedure(s): LAPAROSCOPIC GASTRIC SLEEVE RESECTION UPPER ENDOSCOPY (N/A)  Patient Location: PACU  Anesthesia Type:General  Level of Consciousness: awake, oriented, patient cooperative, lethargic and responds to stimulation  Airway & Oxygen Therapy: Patient Spontanous Breathing and Patient connected to face mask oxygen  Post-op Assessment: Report given to PACU RN, Post -op Vital signs reviewed and stable and Patient moving all extremities  Post vital signs: Reviewed and stable  Complications: No apparent anesthesia complications

## 2013-09-19 ENCOUNTER — Inpatient Hospital Stay (HOSPITAL_COMMUNITY): Payer: BC Managed Care – PPO

## 2013-09-19 ENCOUNTER — Encounter (HOSPITAL_COMMUNITY): Payer: Self-pay | Admitting: Surgery

## 2013-09-19 DIAGNOSIS — Z09 Encounter for follow-up examination after completed treatment for conditions other than malignant neoplasm: Secondary | ICD-10-CM

## 2013-09-19 LAB — CBC WITH DIFFERENTIAL/PLATELET
BASOS ABS: 0 10*3/uL (ref 0.0–0.1)
Basophils Relative: 0 % (ref 0–1)
Eosinophils Absolute: 0 10*3/uL (ref 0.0–0.7)
Eosinophils Relative: 0 % (ref 0–5)
HEMATOCRIT: 40.7 % (ref 36.0–46.0)
Hemoglobin: 14.3 g/dL (ref 12.0–15.0)
LYMPHS PCT: 10 % — AB (ref 12–46)
Lymphs Abs: 1.2 10*3/uL (ref 0.7–4.0)
MCH: 29.6 pg (ref 26.0–34.0)
MCHC: 35.1 g/dL (ref 30.0–36.0)
MCV: 84.3 fL (ref 78.0–100.0)
Monocytes Absolute: 0.5 10*3/uL (ref 0.1–1.0)
Monocytes Relative: 4 % (ref 3–12)
Neutro Abs: 9.8 10*3/uL — ABNORMAL HIGH (ref 1.7–7.7)
Neutrophils Relative %: 86 % — ABNORMAL HIGH (ref 43–77)
PLATELETS: 330 10*3/uL (ref 150–400)
RBC: 4.83 MIL/uL (ref 3.87–5.11)
RDW: 12.1 % (ref 11.5–15.5)
WBC: 11.5 10*3/uL — AB (ref 4.0–10.5)

## 2013-09-19 LAB — HEMOGLOBIN AND HEMATOCRIT, BLOOD
HEMATOCRIT: 41.1 % (ref 36.0–46.0)
Hemoglobin: 14 g/dL (ref 12.0–15.0)

## 2013-09-19 MED ORDER — IOHEXOL 300 MG/ML  SOLN
50.0000 mL | Freq: Once | INTRAMUSCULAR | Status: AC | PRN
  Administered 2013-09-19: 45 mL via ORAL

## 2013-09-19 NOTE — Progress Notes (Signed)
*  PRELIMINARY RESULTS* Vascular Ultrasound Lower extremity venous duplex has been completed.  Preliminary findings: no evidence of DVT in visualized veins.   Farrel DemarkJill Eunice, RDMS, RVT  09/19/2013, 9:35 AM

## 2013-09-19 NOTE — Care Management Note (Signed)
    Page 1 of 1   09/19/2013     10:19:18 AM CARE MANAGEMENT NOTE 09/19/2013  Patient:  [name],[name]   Account Number:  [id number]  Date Initiated:  09/19/2013  Documentation initiated by:  Lorenda IshiharaPEELE,SUZANNE  Subjective/Objective Assessment:   33 yo female admitted s/p robotic hysterectomy. PTA lived at home with spouse.     Action/Plan:   Home when stable   Anticipated DC Date:  09/21/2013   Anticipated DC Plan:  HOME/SELF CARE      DC Planning Services  CM consult      Choice offered to / List presented to:             Status of service:  Completed, signed off Medicare Important Message given?  NO (If response is "NO", the following Medicare IM given date fields will be blank) Date Medicare IM given:   Date Additional Medicare IM given:    Discharge Disposition:  HOME/SELF CARE  Per UR Regulation:  Reviewed for med. necessity/level of care/duration of stay  If discussed at Long Length of Stay Meetings, dates discussed:    Comments:

## 2013-09-19 NOTE — Progress Notes (Signed)
General Surgery Note  LOS: 1 day  POD -  1 Day Post-Op  Assessment/Plan: 1.  LAPAROSCOPIC GASTRIC SLEEVE RESECTION, UPPER ENDOSCOPY - 09/18/2013 - D. Newman  Doing okay.  Await swallow 2. HTN  3. OSA   She does use her CPAP because of the mask. She is worried about waking up with the mask on her face. 4.  DVT prophylaxis - SQ Heparin   Active Problems:   Morbid obesity   Subjective:  Has walked.  Some nausea early, but okay now. Objective:   Filed Vitals:   09/19/13 0453  BP: 131/88  Pulse: 91  Temp: 98.5 F (36.9 C)  Resp: 18     Intake/Output from previous day:  06/23 0701 - 06/24 0700 In: 2850 [I.V.:2500; IV Piggyback:350] Out: 3375 [Urine:3375]  Intake/Output this shift:  Total I/O In: -  Out: 500 [Urine:500]   Physical Exam:   General: Obese WF who is alert and oriented.    HEENT: Normal. Pupils equal. .   Lungs: Clear.   Abdomen: Few BS.   Wound: Look okay.    Lab Results:    Recent Labs  09/18/13 1446 09/19/13 0500  WBC  --  11.5*  HGB 13.1 14.3  HCT 39.2 40.7  PLT  --  330    BMET  No results found for this basename: NA, K, CL, CO2, GLUCOSE, BUN, CREATININE, CALCIUM,  in the last 72 hours  PT/INR  No results found for this basename: LABPROT, INR,  in the last 72 hours  ABG  No results found for this basename: PHART, PCO2, PO2, HCO3,  in the last 72 hours   Studies/Results:  No results found.   Anti-infectives:   Anti-infectives   Start     Dose/Rate Route Frequency Ordered Stop   09/18/13 2000  cefOXitin (MEFOXIN) 2 g in dextrose 5 % 50 mL IVPB     2 g 100 mL/hr over 30 Minutes Intravenous 3 times per day 09/18/13 1623 09/18/13 2014   09/18/13 1033  cefOXitin (MEFOXIN) 2 g in dextrose 5 % 50 mL IVPB     2 g 100 mL/hr over 30 Minutes Intravenous On call to O.R. 09/18/13 1033 09/18/13 1200      Ovidio Kinavid Newman, MD, FACS Pager: [phone] [facility] Office: [phone] 09/19/2013

## 2013-09-19 NOTE — Progress Notes (Signed)
Nutrition Education Note  Received consult for diet education per DROP protocol.   Discussed 2 week post op diet with pt. Emphasized that liquids must be non carbonated, non caffeinated, and sugar free. Fluid goals discussed. Pt to follow up with outpatient bariatric RD for further diet progression after 2 weeks. Multivitamins and minerals also reviewed. Teach back method used, pt expressed understanding, expect good compliance.   Diet: First 2 Weeks  You will see the nutritionist about two (2) weeks after your surgery. The nutritionist will increase the types of foods you can eat if you are handling liquids well:  If you have severe vomiting or nausea and cannot handle clear liquids lasting longer than 1 day, call your surgeon  Protein Shake  Drink at least 2 ounces of shake 5-6 times per day  Each serving of protein shakes (usually 8 - 12 ounces) should have a minimum of:  15 grams of protein  And no more than 5 grams of carbohydrate  Goal for protein each day:  Men = 80 grams per day  Women = 60 grams per day  Protein powder may be added to fluids such as non-fat milk or Lactaid milk or Soy milk (limit to 35 grams added protein powder per serving)   Hydration  Slowly increase the amount of water and other clear liquids as tolerated (See Acceptable Fluids)  Slowly increase the amount of protein shake as tolerated  Sip fluids slowly and throughout the day  May use sugar substitutes in small amounts (no more than 6 - 8 packets per day; i.e. Splenda)   Fluid Goal  The first goal is to drink at least 8 ounces of protein shake/drink per day (or as directed by the nutritionist); some examples of protein shakes are Syntrax Nectar, Adkins Advantage, EAS Edge HP, and Unjury. See handout from pre-op Bariatric Education Class:  Slowly increase the amount of protein shake you drink as tolerated  You may find it easier to slowly sip shakes throughout the day  It is important to get your proteins  in first  Your fluid goal is to drink 64 - 100 ounces of fluid daily  It may take a few weeks to build up to this  32 oz (or more) should be clear liquids  And  32 oz (or more) should be full liquids (see below for examples)  Liquids should not contain sugar, caffeine, or carbonation   Clear Liquids:  Water or Sugar-free flavored water (i.e. Fruit H2O, Propel)  Decaffeinated coffee or tea (sugar-free)  Crystal Lite, Wyler's Lite, Minute Maid Lite  Sugar-free Jell-O  Bouillon or broth  Sugar-free Popsicle: *Less than 20 calories each; Limit 1 per day   Full Liquids:  Protein Shakes/Drinks + 2 choices per day of other full liquids  Full liquids must be:  No More Than 12 grams of Carbs per serving  No More Than 3 grams of Fat per serving  Strained low-fat cream soup  Non-Fat milk  Fat-free Lactaid Milk  Sugar-free yogurt (Dannon Lite & Fit, Greek yogurt)     Heather Winkler MS, RD, LDN [phone] Pager [phone] Weekend/After Hours Pager  

## 2013-09-20 LAB — CBC WITH DIFFERENTIAL/PLATELET
BASOS ABS: 0 10*3/uL (ref 0.0–0.1)
Basophils Relative: 0 % (ref 0–1)
EOS ABS: 0 10*3/uL (ref 0.0–0.7)
Eosinophils Relative: 0 % (ref 0–5)
HCT: 38.3 % (ref 36.0–46.0)
Hemoglobin: 13 g/dL (ref 12.0–15.0)
LYMPHS PCT: 30 % (ref 12–46)
Lymphs Abs: 3.8 10*3/uL (ref 0.7–4.0)
MCH: 29.4 pg (ref 26.0–34.0)
MCHC: 33.9 g/dL (ref 30.0–36.0)
MCV: 86.7 fL (ref 78.0–100.0)
MONOS PCT: 6 % (ref 3–12)
Monocytes Absolute: 0.8 10*3/uL (ref 0.1–1.0)
Neutro Abs: 8 10*3/uL — ABNORMAL HIGH (ref 1.7–7.7)
Neutrophils Relative %: 64 % (ref 43–77)
PLATELETS: 303 10*3/uL (ref 150–400)
RBC: 4.42 MIL/uL (ref 3.87–5.11)
RDW: 12.6 % (ref 11.5–15.5)
WBC: 12.6 10*3/uL — ABNORMAL HIGH (ref 4.0–10.5)

## 2013-09-20 NOTE — Discharge Summary (Signed)
Physician Discharge Summary  Patient ID:  Christine NeighborsKelly Kunath  MRN: [id number]  DOB/AGE: 35-Mar-1982 35 y.o.  Admit date: 09/18/2013 Discharge date: 09/20/2013  Discharge Diagnoses:  1. Morbid obesity   Initial weight - 276, BMI 42.6  2. HTN  3. OSA   She does use her CPAP because of the mask. She is worried about waking up with the mask on her face. 4.  Tightness feeling in both hands   Active Problems:   Morbid obesity  Operation: Procedure(s): LAPAROSCOPIC GASTRIC SLEEVE RESECTION UPPER ENDOSCOPY on 09/18/2013 - D. Ezzard StandingNewman  Discharged Condition: good  Hospital Course: Christine NeighborsKelly Shepard is an 33 y.o. female whose primary care physician is Astrid DivineGRIFFIN,ELAINE COLLINS, MD and who was admitted 09/18/2013 with a chief complaint of morbid obesity.   She was brought to the operating room on 09/18/2013 and underwent  LAPAROSCOPIC GASTRIC SLEEVE RESECTION UPPER ENDOSCOPY. On the first post day, she had a UGI which showed a normal post op sleeve with no leak.  Dopplers of her LE were negative. She has tolerated water and is to start protein shakes. She does complain of some swelling in her hands.   She is ready for discharge.  The discharge instructions were reviewed with the patient.  Consults: None  Significant Diagnostic Studies: Results for orders placed during the hospital encounter of 09/18/13  PREGNANCY, URINE      Result Value Ref Range   Preg Test, Ur NEGATIVE  NEGATIVE  HEMOGLOBIN AND HEMATOCRIT, BLOOD      Result Value Ref Range   Hemoglobin 13.1  12.0 - 15.0 g/dL   HCT 40.939.2  81.136.0 - 91.446.0 %  CBC WITH DIFFERENTIAL      Result Value Ref Range   WBC 11.5 (*) 4.0 - 10.5 K/uL   RBC 4.83  3.87 - 5.11 MIL/uL   Hemoglobin 14.3  12.0 - 15.0 g/dL   HCT 78.240.7  95.636.0 - 21.346.0 %   MCV 84.3  78.0 - 100.0 fL   MCH 29.6  26.0 - 34.0 pg   MCHC 35.1  30.0 - 36.0 g/dL   RDW 08.612.1  57.811.5 - 46.915.5 %   Platelets 330  150 - 400 K/uL   Neutrophils Relative % 86 (*) 43 - 77 %   Lymphocytes Relative 10 (*) 12 - 46 %    Monocytes Relative 4  3 - 12 %   Eosinophils Relative 0  0 - 5 %   Basophils Relative 0  0 - 1 %   Neutro Abs 9.8 (*) 1.7 - 7.7 K/uL   Lymphs Abs 1.2  0.7 - 4.0 K/uL   Monocytes Absolute 0.5  0.1 - 1.0 K/uL   Eosinophils Absolute 0.0  0.0 - 0.7 K/uL   Basophils Absolute 0.0  0.0 - 0.1 K/uL   Smear Review MORPHOLOGY UNREMARKABLE    HEMOGLOBIN AND HEMATOCRIT, BLOOD      Result Value Ref Range   Hemoglobin 14.0  12.0 - 15.0 g/dL   HCT 62.941.1  52.836.0 - 41.346.0 %  CBC WITH DIFFERENTIAL      Result Value Ref Range   WBC 12.6 (*) 4.0 - 10.5 K/uL   RBC 4.42  3.87 - 5.11 MIL/uL   Hemoglobin 13.0  12.0 - 15.0 g/dL   HCT 24.438.3  01.036.0 - 27.246.0 %   MCV 86.7  78.0 - 100.0 fL   MCH 29.4  26.0 - 34.0 pg   MCHC 33.9  30.0 - 36.0 g/dL   RDW 53.612.6  64.411.5 -  15.5 %   Platelets 303  150 - 400 K/uL   Neutrophils Relative % 64  43 - 77 %   Lymphocytes Relative 30  12 - 46 %   Monocytes Relative 6  3 - 12 %   Eosinophils Relative 0  0 - 5 %   Basophils Relative 0  0 - 1 %   Neutro Abs 8.0 (*) 1.7 - 7.7 K/uL   Lymphs Abs 3.8  0.7 - 4.0 K/uL   Monocytes Absolute 0.8  0.1 - 1.0 K/uL   Eosinophils Absolute 0.0  0.0 - 0.7 K/uL   Basophils Absolute 0.0  0.0 - 0.1 K/uL   Smear Review MORPHOLOGY UNREMARKABLE      Dg Ugi W/water Sol Cm  09/19/2013   CLINICAL DATA:  Post-Op  EXAM: WATER SOLUBLE UPPER GI SERIES  TECHNIQUE: Single-column upper GI series was performed using water soluble contrast.  CONTRAST:  45mL OMNIPAQUE IOHEXOL 300 MG/ML  SOLN  COMPARISON:  None.  FLUOROSCOPY TIME:  49 seconds  FINDINGS: Contrast appreciated within the distal esophagus, postoperative augmented stomach, and emptying into the small bowel. No evidence of contrast extravasation. Postop surgical clips are appreciated.  IMPRESSION: No evidence of postoperative leakage.   Electronically Signed   By: Salome HolmesHector  Cooper M.D.   On: 09/19/2013 11:03   Discharge Exam:  Filed Vitals:   09/20/13 0600  BP: 130/84  Pulse: 74  Temp: 97.9 F (36.6 C)   Resp: 16    General: Obese WF who is alert and generally healthy appearing.  Lungs: Clear to auscultation and symmetric breath sounds. Heart:  RRR. No murmur or rub. Abdomen: Soft. Her incisions look good. No hernia. Normal bowel sounds.   Discharge Medications:     Medication List    STOP taking these medications       ibuprofen 200 MG tablet  Commonly known as:  ADVIL,MOTRIN      TAKE these medications       ALPRAZolam 0.25 MG tablet  Commonly known as:  XANAX  Take 0.25 mg by mouth daily as needed.     AMETHYST 90-20 MCG tablet  Generic drug:  levonorgestrel-ethinyl estradiol  Take 1 tablet by mouth daily.     NASCOBAL 500 MCG/0.1ML Soln  Generic drug:  Cyanocobalamin        Disposition: 01-Home or Self Care      Discharge Instructions   Increase activity slowly    Complete by:  As directed           Activity:  Driving - May drive in 2 or 3 days, if doing well   Lifting - No lifting more than 15 pounds for 7 days, then no limit  Wound Care:   May shower  Diet:  Post op sleeve diet  Follow up appointment:  Call Dr. Allene PyoNewman's office [facility]) at [phone] for an appointment in 2 - 3 weeks.  Medications and dosages:  Resume your home medications.  You have a prescription for:  Oxycodone  Signed: Ovidio Kinavid Newman, M.D., [facility] Office:  [phone]  09/20/2013, 7:30 AM

## 2013-09-20 NOTE — Progress Notes (Signed)
Assessment unchanged. Pt and husband verbalized understanding of dc instructions including follow up care through teach back. Skip EstimableLaurie Deaton, RN in to see pt earlier to complete bariatric teaching prior to dc home. Pt plans to sign up with My Chart soon. My Chart info provided. Discharged via wc to front entrance to meet awaiting vehicle to carry home. Accompanied by NT and husband.

## 2013-09-20 NOTE — Anesthesia Postprocedure Evaluation (Signed)
Anesthesia Post Note  Patient: [name]  Procedure(s) Performed: Procedure(s) (LRB): LAPAROSCOPIC GASTRIC SLEEVE RESECTION UPPER ENDOSCOPY (N/A)  Anesthesia type: General  Patient location: PACU  Post pain: Pain level controlled  Post assessment: Post-op Vital signs reviewed  Last Vitals:  Filed Vitals:   09/20/13 0600  BP: 130/84  Pulse: 74  Temp: 36.6 C  Resp: 16    Post vital signs: Reviewed  Level of consciousness: sedated  Complications: No apparent anesthesia complications

## 2013-09-20 NOTE — Progress Notes (Signed)
Patient alert and oriented, pain is controlled. Patient is tolerating fluids, advanced to protein shake today.  Reviewed Gastric sleeve discharge instructions with patient and patient is able to articulate understanding.  Provided information on BELT program, Support Group and [facility] outpatient pharmacy. All questions answered, will continue to monitor.  

## 2013-09-20 NOTE — Discharge Instructions (Signed)
[facility] - DISCHARGE INSTRUCTIONS TO PATIENT  Activity:  Driving - May drive in 2 or 3 days, if doing well   Lifting - No lifting more than 15 pounds for 7 days, then no limit  Wound Care:   May shower  Diet:  Post op sleeve diet  Follow up appointment:  Call Dr. Allene PyoNewman's office [facility]) at [phone] for an appointment in 2 - 3 weeks.  Medications and dosages:  Resume your home medications.  You have a prescription for:  Oxycodone  Call Dr. Ezzard StandingNewman or his office  [phone]) if you have:  Temperature greater than 100.4,  Persistent nausea and vomiting,  Severe uncontrolled pain,  Redness, tenderness, or signs of infection (pain, swelling, redness, odor or green/yellow discharge around the site),  Difficulty breathing, headache or visual disturbances,  Any other questions or concerns you may have after discharge.  In an emergency, call 911 or go to an Emergency Department at a nearby hospital.       GASTRIC BYPASS/SLEEVE  Home Care Instructions   These instructions are to help you care for yourself when you go home.  Call: If you have any problems. Call [phone] and ask for the surgeon on call If you need immediate [facility] Tell the ER staff you are a new post-op gastric bypass or gastric sleeve patient  Signs and symptoms to report: Severe  vomiting or nausea If you cannot handle clear liquids for longer than 1 day, call your surgeon Abdominal pain which does not get better after taking your pain medication Fever greater than 100.4  F and chills Heart rate over 100 beats a minute Trouble breathing Chest pain Redness,  swelling, drainage, or foul odor at incision (surgical) sites If your incisions open or pull apart Swelling or pain in calf (lower leg) Diarrhea (Loose bowel movements that happen often), frequent watery, uncontrolled bowel movements Constipation, (no bowel movements for 3 days)  if this happens: Take Milk of Magnesia, 2 tablespoons by mouth, 3 times a day for 2 days if needed Stop taking Milk of Magnesia once you have had a bowel movement Call your doctor if constipation continues Or Take Miralax  (instead of Milk of Magnesia) following the label instructions Stop taking Miralax once you have had a bowel movement Call your doctor if constipation continues Anything you think is abnormal for you   Normal side effects after surgery: Unable to sleep at night or unable to concentrate Irritability Being tearful (crying) or depressed  These are common complaints, possibly related to your anesthesia, stress of surgery, and change in lifestyle, that usually go away a few weeks after surgery. If these feelings continue, call your medical doctor.  Wound Care: You may have surgical glue, steri-strips, or staples over your incisions after surgery Surgical glue: Looks like clear film over your incisions and will wear off a little at a time Steri-strips: Adhesive strips of tape over your incisions. You may notice a yellowish color on skin under the steri-strips. This is used to make the steri-strips stick better. Do not pull the steri-strips off - let them fall off Staples: Staples may be removed before you leave the hospital If you go home with staples, call [facility] for an appointment with your surgeons nurse to have staples removed 10 days after surgery, [phone] Showering: You may shower two (2) days after your surgery unless your surgeon tells you differently Wash gently around incisions  with warm soapy water, rinse well, and gently pat dry If you have a drain (tube from your incision), you may need someone to hold this while you shower No tub baths until staples are removed and incisions are healed   Medications: Medications should be liquid or crushed if larger than the size of a dime Extended release pills (medication that releases a little bit at  a time through the  day) should not be crushed Depending on the size and number of medications you take, you may need to space (take a few throughout the day)/change the time you take your medications so that you do not over-fill your pouch (smaller stomach) Make sure you follow-up with you primary care physician to make medication changes needed during rapid weight loss and life -style changes If you have diabetes, follow up with your doctor that orders your diabetes medication(s) within one week after surgery and check your blood sugar regularly  Do not drive while taking narcotics (pain medications)  Do not take acetaminophen (Tylenol) and Roxicet or Lortab Elixir at the same time since these pain medications contain acetaminophen   Diet:  First 2 Weeks You will see the nutritionist about two (2) weeks after your surgery. The nutritionist will increase the types of foods you can eat if you are handling liquids well: If you have severe vomiting or nausea and cannot handle clear liquids lasting longer than 1 day call your surgeon Protein Shake Drink at least 2 ounces of shake 5-6 times per day Each serving of protein shakes (usually 8-12 ounces) should have a minimum of: 15 grams of protein And no more than 5 grams of carbohydrate Goal for protein each day: Men = 80 grams per day Women = 60 grams per day    Protein powder may be added to fluids such as non-fat milk or Lactaid milk or Soy milk (limit to 35 grams added protein powder per serving)  Hydration Slowly increase the amount of water and other clear liquids as tolerated (See Acceptable Fluids) Slowly increase the amount of protein shake as tolerated Sip fluids slowly and throughout the day May use sugar substitutes in small amounts (no more than 6-8 packets per day; i.e. Splenda)  Fluid Goal The first goal is to drink at least 8 ounces of protein shake/drink per day (or as directed by the nutritionist); some examples of protein  shakes are [employer]x Nectar, Dillard'sdkins Advantage, EAS Edge HP, and Unjury. - See handout from pre-op Bariatric Education Class: Slowly increase the amount of protein shake you drink as tolerated You may find it easier to slowly sip shakes throughout the day It is important to get your proteins in first Your fluid goal is to drink 64-100 ounces of fluid daily It may take a few weeks to build up to this  32 oz. (or more) should be clear liquids And 32 oz. (or more) should be full liquids (see below for examples) Liquids should not contain sugar, caffeine, or carbonation  Clear Liquids: Water of Sugar-free flavored water (i.e. Fruit HO, Propel) Decaffeinated coffee or tea (sugar-free) Crystal lite, Wylers Lite, Minute Maid Lite Sugar-free Jell-O Bouillon or broth Sugar-free Popsicle:    - Less than 20 calories each; Limit 1 per day  Full Liquids:                   Protein Shakes/Drinks + 2 choices per day of other full liquids Full liquids must be: No More Than 12 grams of Carbs  per serving No More Than 3 grams of Fat per serving Strained low-fat cream soup Non-Fat milk Fat-free Lactaid Milk Sugar-free yogurt (Dannon Lite & Fit, Greek yogurt)    Vitamins and Minerals Start 1 day after surgery unless otherwise directed by your surgeon 2 Chewable Multivitamin / Multimineral Supplement with iron (i.e. Centrum for Adults) Vitamin B-12, 350-500 micrograms sub-lingual (place tablet under the tongue) each day Chewable Calcium Citrate with Vitamin D-3 (Example: 3 Chewable Calcium  Plus 600 with Vitamin D-3) Take 500 mg three (3) times a day for a total of 1500 mg each day Do not take all 3 doses of calcium at one time as it may cause constipation, and you can only absorb 500 mg at a time Do not mix multivitamins containing iron with calcium supplements;  take 2 hours apart Do not substitute Tums (calcium carbonate) for your calcium Menstruating women and those at risk for anemia ( a blood  disease that causes weakness) may need extra iron Talk to your doctor to see if you need more iron If you need extra iron: Total daily Iron recommendation (including Vitamins) is 50 to 100 mg Iron/day Do not stop taking or change any vitamins or minerals until you talk to your nutritionist or surgeon Your nutritionist and/or surgeon must approve all vitamin and mineral supplements   Activity and Exercise: It is important to continue walking at home. Limit your physical activity as instructed by your doctor. During this time, use these guidelines: Do not lift anything greater than ten  (10) pounds for at least two (2) weeks Do not go back to work or drive until Designer, industrial/product says you can You may have sex when you feel comfortable It is VERY important for female patients to use a reliable birth control method; fertility often increase after surgery Do not get pregnant for at least 18 months Start exercising as soon as your doctor tells you that you can Make sure your doctor approves any physical activity Start with a simple walking program Walk 5-15 minutes each day, 7 days per week Slowly increase until you are walking 30-45 minutes per day Consider joining our BELT program. [phone] or email belt@[employer] .edu   Special Instructions Things to remember: Free counseling is available for you and your family through collaboration between [facility] and [place]. Please call [phone] and leave a message Use your CPAP when sleeping if this applies to you Consider buying a medical alert bracelet that says you had lap-band surgery     You will likely have your first fill (fluid added to your band) 6 - 8 weeks after surgery [facility] has a free Bariatric Surgery Support Group that meets monthly, the 3rd Thursday, 6pm. Calvert Cantor. You can see classes online at [email or web address] It is very important to keep all follow up appointments with your  surgeon, nutritionist, primary care physician, and behavioral health practitioner After the first year, please follow up with your bariatric surgeon and nutritionist at least once a year in order to maintain best weight loss results                    [facility]:  [phone]               [facility]: [phone]               Bariatric Nurse Coordinator: [phone]  Gastric Bypass/Sleeve Home Care Instructions  Rev.  04/2012                                                         Reviewed and Endorsed                                                    by [facility] Patient Education Committee, Jan, 2014

## 2013-10-02 ENCOUNTER — Encounter: Payer: BC Managed Care – PPO | Attending: Surgery

## 2013-10-02 DIAGNOSIS — Z01818 Encounter for other preprocedural examination: Secondary | ICD-10-CM | POA: Insufficient documentation

## 2013-10-02 DIAGNOSIS — Z713 Dietary counseling and surveillance: Secondary | ICD-10-CM | POA: Insufficient documentation

## 2013-10-02 NOTE — Patient Instructions (Signed)
Patient to follow Phase 3A-Soft, High Protein Diet and follow-up at [facility] in 6 weeks for 2 months post-op nutrition visit for diet advancement. 

## 2013-10-02 NOTE — Progress Notes (Signed)
Bariatric Class:  Appt start time: 1530 end time:  1630.  2 Week Post-Operative Nutrition Class  Patient was seen on 10/02/2013 for Post-Operative Nutrition education at the [facility].   Surgery date: 09/18/2013  Surgery type: Gastric sleeve  Start weight at [facility]: 272 lbs on 08/04/2013  Weight today: 249.0 lbs  Weight loss: 24.5 lbs  TANITA BODY COMP RESULTS   09/03/2013  10/02/13  BMI (kg/m^2)  42.2  38.4  Fat Mass (lbs)  139  127.5  Fat Free Mass (lbs)  134.5  121.5  Total Body Water (lbs)  98.5  89.0     The following the learning objectives were met by the patient during this course:  Identifies Phase 3A (Soft, High Proteins) Dietary Goals and will begin from 2 weeks post-operatively to 2 months post-operatively  Identifies appropriate sources of fluids and proteins   States protein recommendations and appropriate sources post-operatively  Identifies the need for appropriate texture modifications, mastication, and bite sizes when consuming solids  Identifies appropriate multivitamin and calcium sources post-operatively  Describes the need for physical activity post-operatively and will follow MD recommendations  States when to call healthcare provider regarding medication questions or post-operative complications  Handouts given during class include:  Phase 3A: Soft, High Protein Diet Handout  Follow-Up Plan: Patient will follow-up at [facility] in 6 weeks for 2 month post-op nutrition visit for diet advancement per MD.

## 2013-10-03 ENCOUNTER — Telehealth: Payer: Self-pay | Admitting: Dietician

## 2013-10-03 NOTE — Telephone Encounter (Signed)
Email from patient on 10/02/2013:  Christine ChambersHi Christine,   I went to my post op appointment today and have been approved for certain solid foods. Christine RivalYay!.  I've a couple of questions:  1. Christine IvanLiz said in class today that [facility] Shepard was okay but I thought we were not allowed to eat any fruits and vegetables other than squash, zucchini, and/or beans. I really would love to add plain tomato slices to my diet.  Just thought I would double check since that is what Shepard is mainly made out of.  2. The list says we can have eggs. Does that mean scrabbled, boiled, or poached only? I was wondering if I could fry an egg with cooking spray.  3. I just bought Biotin and was wondering if that can be taken at the same time as one of the other vitamins or does it bond/not work when you do that?  Thanks!  Christine NeighborsKelly Shepard  My reply on 10/03/2013:  Minerva AreolaHi [name], Peri JeffersonGood questions!   1. Beans, zucchini, green beans and squash are vegetables that are just very easy to digest after surgery. I would steer away from raw tomatoes at this point. If you wanted to have some cooked tomatoes without the skin (like in an omelet or Shepard) that would probably be okay. 2. Foy GuadalajaraFried eggs with cooking spray are fine! 3. Biotin can be taken with any of your other supplements.    Leanna BattlesLeslie S. Mayford KnifeWilliams, [name], [name], [name] Clinical Registered Dietitian [facility] O: [phone] F: [phone]

## 2013-10-08 ENCOUNTER — Telehealth (INDEPENDENT_AMBULATORY_CARE_PROVIDER_SITE_OTHER): Payer: Self-pay

## 2013-10-08 ENCOUNTER — Telehealth (HOSPITAL_COMMUNITY): Payer: Self-pay

## 2013-10-08 NOTE — Telephone Encounter (Signed)
Pt s/p gastric sleeve 6/23 by Dr Ezzard StandingNewman. Pt was calling to see if she could get into the swimming pool and if she could start exercising. Advised pt that as long as her incisions were closed and not open that she can get into the swimming pool. Advised pt that as far as exercising, no lifting, pushing or pulling greater than 15 lbs. Pt verbalized understanding.

## 2013-10-11 ENCOUNTER — Other Ambulatory Visit (INDEPENDENT_AMBULATORY_CARE_PROVIDER_SITE_OTHER): Payer: Self-pay

## 2013-10-11 ENCOUNTER — Ambulatory Visit (INDEPENDENT_AMBULATORY_CARE_PROVIDER_SITE_OTHER): Payer: BC Managed Care – PPO | Admitting: Surgery

## 2013-10-11 ENCOUNTER — Encounter (INDEPENDENT_AMBULATORY_CARE_PROVIDER_SITE_OTHER): Payer: Self-pay | Admitting: Surgery

## 2013-10-11 DIAGNOSIS — Z9889 Other specified postprocedural states: Secondary | ICD-10-CM

## 2013-10-11 DIAGNOSIS — Z903 Acquired absence of stomach [part of]: Secondary | ICD-10-CM

## 2013-10-11 NOTE — Progress Notes (Signed)
Re:   [name] DOB:   1980-06-08 MRN:   [id number]  ASSESSMENT AND PLAN: 1.  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  I signed papers for the BELT program.  She'll see me in 3 months and get labs before the return visit.   2.  HTN 3.  OSA   She does use her CPAP because of the mask.  She is worried about waking up with the mask on her face. 4.  On birth control pills  Chief Complaint  Patient presents with  . Bariatric Follow Up   REFERRING PHYSICIAN: Astrid DivineGRIFFIN,ELAINE COLLINS, MD  HISTORY OF PRESENT ILLNESS: [name] is a 33 y.o. (DOB: 1980-06-08)  white  female whose primary care physician is Christine DivineGRIFFIN,ELAINE COLLINS, MD and comes to me today for post op lap sleeve gastrectomy. Son, [name], with patient. She is doing well.  She has had a few periods of nausea, but overall is doing well. She's had some issues with her bowels.  At first she had diarrhea, then constipation, now she is better, but on the constipated side. She is doing well with exercise.  She is seeing [name] back in about 3 months.   Weight history: Patient says that she has been overweight since age 89. She has tried multiple diets during that time. She went to the information session by Dr. Ovidio Kinavid Claris Shepard. She has a friend of a friend who had a sleeve gastrectomy. She knows patient had a gastric bypass. But the gastric bypass scares her.  She has tried Weight Watchers x2, low calorie diet, LA Diet, and multiple other diets. She tried Slim fast and phentermine. She had a mall success with about a 20 pound weight loss on Weight Watchers one time, but nothing sustained.  Note her husband does most of the cooking at home.  But she is already trying to change what they have in their home. UGI - 07/04/2013 - small HH [name] - 08/12/2103    Past Medical History  Diagnosis Date  . Hiatal hernia   . Complication of anesthesia     feels panic with  face mask  . Hypertension     previously on BP med - none currently  . Recurrent dry cough   . Headache(784.0)     hx migraines  . Sleep apnea     unable to tolerate mask   . Depression   . Anxiety     xanax prn      Past Surgical History  Procedure Laterality Date  . Breath tek h pylori N/A 07/20/2013    Procedure: BREATH TEK H PYLORI;  Surgeon: Christine Cockingavid H Janitza Revuelta, MD;  [facility];  Service: General;  Laterality: N/A;  . Appendectomy    . Cholecystectomy    . Laparoscopic gastric sleeve resection N/A 09/18/2013    Procedure: LAPAROSCOPIC GASTRIC SLEEVE RESECTION UPPER ENDOSCOPY;  Surgeon: Christine Cockingavid H Alveta Quintela, MD;  Location: [facility];  Service: General;  Laterality: N/A;      Current Outpatient Prescriptions  Medication Sig Dispense Refill  . ALPRAZolam (XANAX) 0.25 MG tablet Take 0.25 mg by mouth daily as needed.       Wynona Dove. AMETHYST 90-20 MCG tablet Take 1 tablet by mouth daily.       Marland Kitchen. NASCOBAL 500 MCG/0.1ML SOLN        No current facility-administered medications for this visit.     No Known  Allergies  REVIEW OF SYSTEMS: Cardiac:  HTN x 1 years.  No history of seeing a cardiologist. Pulmonary:  Has OSA since around 2008.  But cannot wear CPAP.  She actually thinks it may be better, though now her husband's snoring bothers her. Gastrointestinal:  No history of stomach disease.  No history of liver disease.  [name] did lap chole 11/04/2003.  She had CBD stones - [name] did ERCP - 8.11/2013 No history of pancreas disease.  No history of colon disease.  [name] did lap appendectomy 03/16/2003.  SOCIAL and FAMILY HISTORY: Married. Works as Agricultural engineer school admissions at [name] Has 2 boys:  Ages 4 and 13.  PHYSICAL EXAM: BP 118/80  Pulse 76  Resp 16  Ht 5' 7.75" (1.721 m)  Wt 247 lb (112.038 kg)  BMI 37.83 kg/m2  General: WN obese WF who is alert and generally healthy appearing.  HEENT: Normal. Pupils equal. Neck: Supple. No mass.  No thyroid  mass. Lymph Nodes:  No supraclavicular or cervical nodes. Lungs: Clear to auscultation and symmetric breath sounds. Heart:  RRR. No murmur or rub. Abdomen: Soft. No mass. Incisions look okay.  DATA REVIEWED: Epic notes  Christine Kin, MD,  [facility], [place] [address] [place]  [address]   [place], [place]    [address] Phone:  [phone] FAX:  [phone]

## 2013-10-30 NOTE — Telephone Encounter (Signed)
Made discharge phone call to patient per DROP protocol. Asking the following questions.    1. Do you have someone to care for you now that you are home?  y 2. Are you having pain now that is not relieved by your pain medication?  n 3. Are you able to drink the recommended daily amount of fluids (48 ounces minimum/day) and protein (60-80 grams/day) as prescribed by the dietitian or nutritional counselor?  Working on it 4. Are you taking the vitamins and minerals as prescribed?  y 5. Do you have the "on call" number to contact your surgeon if you have a problem or question?  y 6. Are your incisions free of redness, swelling or drainage? (If steri strips, address that these can fall off, shower as tolerated) y 7. Have your bowels moved since your surgery?  If not, are you passing gas?  y 8. Are you up and walking 3-4 times per day?  y    1. Do you have an appointment made to see your surgeon in the next month?  y 2. Were you provided your discharge medications before your surgery or before you were discharged from the hospital and are you taking them without problem?  y 3. Were you provided phone numbers to the clinic/surgeon's office?  y 4. Did you watch the patient education video module in the (clinic, surgeon's office, etc.) before your surgery? y 5. Do you have a discharge checklist that was provided to you in the hospital to reference with instructions on how to take care of yourself after surgery?  y 6. Did you see a dietitian or nutritional counselor while you were in the hospital?  y 7. Do you have an appointment to see a dietitian or nutritional counselor in the next month?  y

## 2013-11-08 ENCOUNTER — Encounter: Payer: BC Managed Care – PPO | Attending: Surgery | Admitting: Dietician

## 2013-11-08 DIAGNOSIS — Z713 Dietary counseling and surveillance: Secondary | ICD-10-CM | POA: Insufficient documentation

## 2013-11-08 DIAGNOSIS — Z01818 Encounter for other preprocedural examination: Secondary | ICD-10-CM | POA: Insufficient documentation

## 2013-11-08 NOTE — Patient Instructions (Signed)
Goals:  Follow Phase 3B: High Protein + Non-Starchy Vegetables  Eat 3-6 small meals/snacks, every 3-5 hrs  Increase lean protein foods to meet 60g goal  Increase fluid intake to 64oz +  Avoid drinking 15 minutes before, during and 30 minutes after eating  Aim for >30 min of physical activity daily  

## 2013-11-08 NOTE — Progress Notes (Signed)
  Follow-up visit:  8 Weeks Post-Operative Sleeve Gastrectomy Surgery  Medical Nutrition Therapy:  Appt start time: 1430 end time:  1500.  Primary concerns today: Post-operative Bariatric Surgery Nutrition Management. Tresa EndoKelly returns with a 17.5 lb fat loss (9.5 lbs total weight loss). Tolerating all foods. Has tried some additional vegetables. Is tired of having protein drinks.   Surgery date: 09/18/2013  Surgery type: Gastric sleeve  [facility]: [facility] lbs on 08/04/2013  Weight today: 239.5 lbs   Weight change: 9.5 lbs, 17.5 lbs fat loss  Total weight loss: 32.5 Weight loss goal: 175 lbs  TANITA BODY COMP RESULTS   09/03/2013  10/02/13  11/08/13  BMI (kg/m^2)  42.2  38.4  37.0  Fat Mass (lbs)  139  127.5  110.0  Fat Free Mass (lbs)  134.5  121.5  129.5  Total Body Water (lbs)  98.5  89.0  95.0    Preferred Learning Style:   No preference indicated   Learning Readiness:   Ready  24-hr recall: B (AM): fried or scrambled egg or Canadian bacon and egg white with low fat cheese or EAS protein drink (6- 17 g) Snk (AM): none usually, may have yogurt (12 g) L (PM): 2-3 oz chicken breast (14-21 g) Snk (PM): none  D (PM): hamburger patty with cheese or [place] dog or chicken (14-21 g) Snk (PM): none  Fluid intake: water with lemon, sometimes a protein shake, small G2 (takes 2 days) (50-64 fl oz) Estimated total protein intake: 46 - 71 g  Medications: none Supplementation: taking  Using straws: Yes, has trouble getting in fluid without straw Drinking while eating: No Hair loss: No Carbonated beverages: No N/V/D/C: some will have diarrhea after a protein shake Dumping syndrome: No  Recent physical activity:  Walking every day at least for 30-60 minutes around [employer]  Progress Towards Goal(s):  In progress.  Handouts given during visit include:  Phase 3B High Protein + Non Starchy Vegetables   Nutritional Diagnosis:  [place]-3.3 Overweight/obesity related to past poor dietary  habits and physical inactivity as evidenced by patient w/ recent sleeve gastrectomy surgery following dietary guidelines for continued weight loss.    Intervention:  Nutrition education/diet advancement.  Teaching Method Utilized:  Visual Auditory Hands on  Barriers to learning/adherence to lifestyle change: none  Demonstrated degree of understanding via:  Teach Back   Monitoring/Evaluation:  Dietary intake, exercise, and body weight. Follow up in 1 months for 3 month post-op visit.

## 2013-12-17 ENCOUNTER — Encounter: Payer: BC Managed Care – PPO | Attending: Surgery | Admitting: Dietician

## 2013-12-17 DIAGNOSIS — Z01818 Encounter for other preprocedural examination: Secondary | ICD-10-CM | POA: Diagnosis not present

## 2013-12-17 DIAGNOSIS — Z713 Dietary counseling and surveillance: Secondary | ICD-10-CM | POA: Insufficient documentation

## 2013-12-17 NOTE — Patient Instructions (Signed)
Goals:  Follow Phase 3B: High Protein + Non-Starchy Vegetables  Eat 3-6 small meals/snacks, every 3-5 hrs  Increase lean protein foods to meet 60g goal  Increase fluid intake to 64oz +  Avoid drinking 15 minutes before, during and 30 minutes after eating  Aim for >30 min of physical activity daily  Try swallowing multivitamin if it's too hard to chew it  Add a few grapes with cheese stick if you want it

## 2013-12-17 NOTE — Progress Notes (Signed)
  Follow-up visit:  12 Weeks Post-Operative Sleeve Gastrectomy Surgery  Medical Nutrition Therapy:  Appt start time: 845 end time:  910.  Primary concerns today: Post-operative Bariatric Surgery Nutrition Management. [name] returns with a 14 lb weight loss. Tolerating all foods. Has tried some additional vegetables.  Surgery date: 09/18/2013  Surgery type: Gastric sleeve  Start weight at [facility]: 272 lbs on 08/04/2013  Weight today: 225.5 lbs   Weight change: 14 lbs Total weight loss: 46.5 Weight loss goal: 175 lbs  TANITA BODY COMP RESULTS   09/03/2013  10/02/13  11/08/13 12/17/13  BMI (kg/m^2)  42.2  38.4  37.0 34.8  Fat Mass (lbs)  139  127.5  110.0 103.0  Fat Free Mass (lbs)  134.5  121.5  129.5 122.5  Total Body Water (lbs)  98.5  89.0  95.0 89.5    Preferred Learning Style:   No preference indicated   Learning Readiness:   Ready  24-hr recall: B (AM): EAS protein drink (17 g) Snk (AM): none usually, may have yogurt or string cheese or carrots and hummus (6-12 g) L (PM): stir fry with [place], [place] dogs, sausage 2-3 oz meat (14-21 g) Snk (PM): hummus and carrots D (PM): hamburger patty with cheese or [place] dog or chicken or beans with vegetables  (14-21 g) Snk (PM): none  Fluid intake: coffee with sugar free and fat free creamer, water with lemon, sometimes a protein shake, small G2 (takes 2 days) (at least 64 fl oz) Estimated total protein intake: 64-71g  Medications: none Supplementation: taking -  though struggling more than before. Has a timer on her phone.  Using straws: Yes, has trouble getting in fluid without straw Drinking while eating: No Hair loss: No Carbonated beverages: had a Diet Pepsi once  N/V/D/C: No Dumping syndrome: No  Recent physical activity:  Walking every day at least for 30-60 minutes around [employer], yoga/pilates and classes at [facility], cycle (exercise everyday)  Progress Towards Goal(s):  In progress.    Nutritional Diagnosis:  [name]-3.3  Overweight/obesity related to past poor dietary habits and physical inactivity as evidenced by patient w/ recent sleeve gastrectomy surgery following dietary guidelines for continued weight loss.    Intervention:  Nutrition education/diet reinforcement.  Goals:  Follow Phase 3B: High Protein + Non-Starchy Vegetables  Eat 3-6 small meals/snacks, every 3-5 hrs  Increase lean protein foods to meet 60g goal  Increase fluid intake to 64oz +  Avoid drinking 15 minutes before, during and 30 minutes after eating  Aim for >30 min of physical activity daily  Try swallowing multivitamin if it's too hard to chew it  Add a few grapes with cheese stick if you want it  Teaching Method Utilized:  Visual Auditory Hands on  Barriers to learning/adherence to lifestyle change: none  Demonstrated degree of understanding via:  Teach Back   Monitoring/Evaluation:  Dietary intake, exercise, and body weight. Follow up in 2 months for 5 month post-op visit.

## 2014-02-18 ENCOUNTER — Encounter: Payer: BC Managed Care – PPO | Attending: Surgery | Admitting: Dietician

## 2014-02-18 DIAGNOSIS — Z713 Dietary counseling and surveillance: Secondary | ICD-10-CM | POA: Diagnosis not present

## 2014-02-18 DIAGNOSIS — Z6832 Body mass index (BMI) 32.0-32.9, adult: Secondary | ICD-10-CM | POA: Insufficient documentation

## 2014-02-18 NOTE — Progress Notes (Signed)
  Follow-up visit:  12 Weeks Post-Operative Sleeve Gastrectomy Surgery  Medical Nutrition Therapy:  Appt start time: 730 end time:  800.  Primary concerns today: Post-operative Bariatric Surgery Nutrition Management. [name] returns with a 14 lb weight loss. Started adding protein bars and peanuts since she's been getting bored with meat and vegetables. Has had wheat crackers too.   Does not like protein shakes anymore. Not taking supplements and feeling "defiant". Would like to work on taking them again.    Surgery date: 09/18/2013  Surgery type: Gastric sleeve  Start weight at [name]: 272 lbs on 08/04/2013  Weight today 211.5 lbs   Weight change: 14 lbs Total weight loss: 60.5 Weight loss goal: 175 lbs  TANITA BODY COMP RESULTS   09/03/2013  10/02/13  11/08/13 12/17/13 02/18/14  BMI (kg/m^2)  42.2  38.4  37.0 34.8 32.6  Fat Mass (lbs)  139  127.5  110.0 103.0 96.0  Fat Free Mass (lbs)  134.5  121.5  129.5 122.5 115.5  Total Body Water (lbs)  98.5  89.0  95.0 89.5 84.5    Preferred Learning Style:   No preference indicated   Learning Readiness:   Ready  24-hr recall: B (9 AM): 3 blocks of cheese and 2 grapes and 1 cracker or 1/2  premier protein bar (6-15) Snk (AM): none  L (PM): refried beans and chicken or ham and broccoli (meat and vegetable) 2-3 oz meat (14-21 g) Snk (3 PM): refried beans and chicken or ham and broccoli (meat and vegetable) 2-3 oz meat (14-21 g) D (6 PM): 2-3 oz of protein and vegetables  (14-21 g) Snk (PM): none  Fluid intake: coffee with sugar free and fat free creamer, water, diet green tea  (at least 64 fl oz) Estimated total protein intake: ~60 g  Medications: none Supplementation: struggling to get in supplements  Using straws: every once in while Drinking while eating: No Hair loss: No Carbonated beverages: No  N/V/D/C: No Dumping syndrome: No  Recent physical activity:  Walking 3-4 x week [place] [employer], classes start back in February    Progress Towards Goal(s):  In progress.    Nutritional Diagnosis:  [place]-3.3 Overweight/obesity related to past poor dietary habits and physical inactivity as evidenced by patient w/ recent sleeve gastrectomy surgery following dietary guidelines for continued weight loss.    Intervention:  Nutrition education/diet reinforcement.  Goals:  Follow Phase 3B: High Protein + Non-Starchy Vegetables  Eat 3-6 small meals/snacks, every 3-5 hrs  Increase lean protein foods to meet 60g goal  Increase fluid intake to 64oz +  Avoid drinking 15 minutes before, during and 30 minutes after eating  Aim for >30 min of physical activity daily, look to keep up with strength training (even during work in your office)  Try taking vitamins/minerals every time you eat. Even if you don't take everything, some is better than none.   Add a few grapes with cheese stick if you want it  Try [employer] Protein Chips ([employer], Complete Nutrition)  Teaching Method Utilized:  Visual Auditory Hands on  Barriers to learning/adherence to lifestyle change: none  Demonstrated degree of understanding via:  Teach Back   Monitoring/Evaluation:  Dietary intake, exercise, and body weight. Follow up in 2 months for 7 month post-op visit.

## 2014-02-18 NOTE — Patient Instructions (Addendum)
Goals:  Follow Phase 3B: High Protein + Non-Starchy Vegetables  Eat 3-6 small meals/snacks, every 3-5 hrs  Increase lean protein foods to meet 60g goal  Increase fluid intake to 64oz +  Avoid drinking 15 minutes before, during and 30 minutes after eating  Aim for >30 min of physical activity daily, look to keep up with strength training (even during work in your office)  Try taking vitamins/minerals every time you eat. Even if you don't take everything, some is better than none.   Add a few grapes with cheese stick if you want it  Try Quest Protein Chips ([employer], Complete Nutrition)  Surgery date: 09/18/2013  Surgery type: Gastric sleeve  [facility] on 08[facility]04/2013  Weight today 211.5 lbs  Weight change: 14 lbs Total weight loss: 60.5 Weight loss goal: 175 lbs  TANITA BODY COMP RESULTS   09/03/2013  10/02/13  11/08/13 12/17/13 02/18/14  BMI (kg/m^2)  42.2  38.4  37.0 34.8 32.6  Fat Mass (lbs)  139  127.5  110.0 103.0 96.0  Fat Free Mass (lbs)  134.5  121.5  129.5 122.5 115.5  Total Body Water (lbs)  98.5  89.0  95.0 89.5 84.5

## 2014-03-21 LAB — CBC WITH DIFFERENTIAL/PLATELET
Basophils Absolute: 0 10*3/uL (ref 0.0–0.1)
Basophils Relative: 0 % (ref 0–1)
EOS ABS: 0.1 10*3/uL (ref 0.0–0.7)
EOS PCT: 2 % (ref 0–5)
HEMATOCRIT: 36.8 % (ref 36.0–46.0)
Hemoglobin: 11.8 g/dL — ABNORMAL LOW (ref 12.0–15.0)
Lymphocytes Relative: 37 % (ref 12–46)
Lymphs Abs: 1.8 10*3/uL (ref 0.7–4.0)
MCH: 29 pg (ref 26.0–34.0)
MCHC: 32.1 g/dL (ref 30.0–36.0)
MCV: 90.4 fL (ref 78.0–100.0)
Monocytes Absolute: 0.2 10*3/uL (ref 0.1–1.0)
Monocytes Relative: 4 % (ref 3–12)
Neutro Abs: 2.7 10*3/uL (ref 1.7–7.7)
Neutrophils Relative %: 57 % (ref 43–77)
PLATELETS: 295 10*3/uL (ref 150–400)
RBC: 4.07 MIL/uL (ref 3.87–5.11)
RDW: 12.7 % (ref 11.5–15.5)
WBC: 4.8 10*3/uL (ref 4.0–10.5)

## 2014-03-21 LAB — COMPREHENSIVE METABOLIC PANEL
ALBUMIN: 4.2 g/dL (ref 3.5–5.2)
ALK PHOS: 187 U/L — AB (ref 39–117)
ALT: 32 U/L (ref 0–35)
AST: 22 U/L (ref 0–37)
BUN: 7 mg/dL (ref 6–23)
CALCIUM: 9.5 mg/dL (ref 8.4–10.5)
CHLORIDE: 109 meq/L (ref 96–112)
CO2: 26 mEq/L (ref 19–32)
Creat: 0.64 mg/dL (ref 0.50–1.10)
Glucose, Bld: 84 mg/dL (ref 70–99)
POTASSIUM: 4.3 meq/L (ref 3.5–5.3)
Sodium: 143 mEq/L (ref 135–145)
Total Bilirubin: 1.1 mg/dL (ref 0.2–1.2)
Total Protein: 6.6 g/dL (ref 6.0–8.3)

## 2014-03-21 LAB — HEMOGLOBIN A1C
Hgb A1c MFr Bld: 5.2 % (ref <5.7)
Mean Plasma Glucose: 103 mg/dL (ref <117)

## 2014-03-21 LAB — MAGNESIUM: Magnesium: 2 mg/dL (ref 1.5–2.5)

## 2014-04-22 ENCOUNTER — Ambulatory Visit: Payer: BC Managed Care – PPO | Admitting: Dietician

## 2014-05-04 ENCOUNTER — Encounter (HOSPITAL_COMMUNITY): Payer: Self-pay | Admitting: Emergency Medicine

## 2014-05-04 ENCOUNTER — Emergency Department (HOSPITAL_COMMUNITY)
Admission: EM | Admit: 2014-05-04 | Discharge: 2014-05-04 | Disposition: A | Discharge status: Home / Self Care | Payer: BC Managed Care – PPO | Source: Home / Self Care | Attending: Emergency Medicine | Admitting: Emergency Medicine

## 2014-05-04 DIAGNOSIS — R42 Dizziness and giddiness: Secondary | ICD-10-CM

## 2014-05-04 MED ORDER — MECLIZINE HCL 50 MG PO TABS: 50.0000 mg | ORAL_TABLET | Freq: Three times a day (TID) | ORAL | Status: DC | PRN

## 2014-05-04 NOTE — Discharge Instructions (Signed)
Benign Positional Vertigo Vertigo means you feel like you or your surroundings are moving when they are not. Benign positional vertigo is the most common form of vertigo. Benign means that the cause of your condition is not serious. Benign positional vertigo is more common in older adults. CAUSES  Benign positional vertigo is the result of an upset in the labyrinth system. This is an area in the middle ear that helps control your balance. This may be caused by a viral infection, head injury, or repetitive motion. However, often no specific cause is found. SYMPTOMS  Symptoms of benign positional vertigo occur when you move your head or eyes in different directions. Some of the symptoms may include: 1. Loss of balance and falls. 2. Vomiting. 3. Blurred vision. 4. Dizziness. 5. Nausea. 6. Involuntary eye movements (nystagmus). DIAGNOSIS  Benign positional vertigo is usually diagnosed by physical exam. If the specific cause of your benign positional vertigo is unknown, your caregiver may perform imaging tests, such as magnetic resonance imaging (MRI) or computed tomography (CT). TREATMENT  Your caregiver may recommend movements or procedures to correct the benign positional vertigo. Medicines such as meclizine, benzodiazepines, and medicines for nausea may be used to treat your symptoms. In rare cases, if your symptoms are caused by certain conditions that affect the inner ear, you may need surgery. HOME CARE INSTRUCTIONS   Follow your caregiver's instructions.  Move slowly. Do not make sudden body or head movements.  Avoid driving.  Avoid operating heavy machinery.  Avoid performing any tasks that would be dangerous to you or others during a vertigo episode.  Drink enough fluids to keep your urine clear or pale yellow. SEEK IMMEDIATE MEDICAL CARE IF:   You develop problems with walking, weakness, numbness, or using your arms, hands, or legs.  You have difficulty speaking.  You develop  severe headaches.  Your nausea or vomiting continues or gets worse.  You develop visual changes.  Your family or friends notice any behavioral changes.  Your condition gets worse.  You have a fever.  You develop a stiff neck or sensitivity to light. MAKE SURE YOU:   Understand these instructions.  Will watch your condition.  Will get help right away if you are not doing well or get worse. Document Released: 12/21/2005 Document Revised: 06/07/2011 Document Reviewed: 12/03/2010 [facility] Patient Information 2015 [place], [place]. This information is not intended to replace advice given to you by your health care provider. Make sure you discuss any questions you have with your health care provider.  Vertigo Vertigo means you feel like you or your surroundings are moving when they are not. Vertigo can be dangerous if it occurs when you are at work, driving, or performing difficult activities.  CAUSES  Vertigo occurs when there is a conflict of signals sent to your brain from the visual and sensory systems in your body. There are many different causes of vertigo, including: 7. Infections, especially in the inner ear. 8. A bad reaction to a drug or misuse of alcohol and medicines. 9. Withdrawal from drugs or alcohol. 10. Rapidly changing positions, such as lying down or rolling over in bed. 11. A migraine headache. 12. Decreased blood flow to the brain. 13. Increased pressure in the brain from a head injury, infection, tumor, or bleeding. SYMPTOMS  You may feel as though the world is spinning around or you are falling to the ground. Because your balance is upset, vertigo can cause nausea and vomiting. You may have involuntary eye movements (  nystagmus). DIAGNOSIS  Vertigo is usually diagnosed by physical exam. If the cause of your vertigo is unknown, your caregiver may perform imaging tests, such as an MRI scan (magnetic resonance imaging). TREATMENT  Most cases of vertigo resolve on  their own, without treatment. Depending on the cause, your caregiver may prescribe certain medicines. If your vertigo is related to body position issues, your caregiver may recommend movements or procedures to correct the problem. In rare cases, if your vertigo is caused by certain inner ear problems, you may need surgery. HOME CARE INSTRUCTIONS   Follow your caregiver's instructions.  Avoid driving.  Avoid operating heavy machinery.  Avoid performing any tasks that would be dangerous to you or others during a vertigo episode.  Tell your caregiver if you notice that certain medicines seem to be causing your vertigo. Some of the medicines used to treat vertigo episodes can actually make them worse in some people. SEEK IMMEDIATE MEDICAL CARE IF:   Your medicines do not relieve your vertigo or are making it worse.  You develop problems with talking, walking, weakness, or using your arms, hands, or legs.  You develop severe headaches.  Your nausea or vomiting continues or gets worse.  You develop visual changes.  A family member notices behavioral changes.  Your condition gets worse. MAKE SURE YOU:  Understand these instructions.  Will watch your condition.  Will get help right away if you are not doing well or get worse. Document Released: 12/23/2004 Document Revised: 06/07/2011 Document Reviewed: 10/01/2010 [facility] Patient Information 2015 [place], [place]. This information is not intended to replace advice given to you by your health care provider. Make sure you discuss any questions you have with your health care provider.  Epley Maneuver Self-Care WHAT IS THE EPLEY MANEUVER? The Epley maneuver is an exercise you can do to relieve symptoms of benign paroxysmal positional vertigo (BPPV). This condition is often just referred to as vertigo. BPPV is caused by the movement of tiny crystals (canaliths) inside your inner ear. The accumulation and movement of canaliths in your inner  ear causes a sudden spinning sensation (vertigo) when you move your head to certain positions. Vertigo usually lasts about 30 seconds. BPPV usually occurs in just one ear. If you get vertigo when you lie on your left side, you probably have BPPV in your left ear. Your health care provider can tell you which ear is involved.  BPPV may be caused by a head injury. Many people older than 50 get BPPV for unknown reasons. If you have been diagnosed with BPPV, your health care provider may teach you how to do this maneuver. BPPV is not life threatening (benign) and usually goes away in time.  WHEN SHOULD I PERFORM THE EPLEY MANEUVER? You can do this maneuver at home whenever you have symptoms of vertigo. You may do the Epley maneuver up to 3 times a day until your symptoms of vertigo go away. HOW SHOULD I DO THE EPLEY MANEUVER? 14. Sit on the edge of a bed or table with your back straight. Your legs should be extended or hanging over the edge of the bed or table.  15. Turn your head halfway toward the affected ear.  16. Lie backward quickly with your head turned until you are lying flat on your back. You may want to position a pillow under your shoulders.  17. Hold this position for 30 seconds. You may experience an attack of vertigo. This is normal. Hold this position until the vertigo  stops. 18. Then turn your head to the opposite direction until your unaffected ear is facing the floor.  19. Hold this position for 30 seconds. You may experience an attack of vertigo. This is normal. Hold this position until the vertigo stops. 20. Now turn your whole body to the same side as your head. Hold for another 30 seconds.  21. You can then sit back up. ARE THERE RISKS TO THIS MANEUVER? In some cases, you may have other symptoms (such as changes in your vision, weakness, or numbness). If you have these symptoms, stop doing the maneuver and call your health care provider. Even if doing these maneuvers relieves  your vertigo, you may still have dizziness. Dizziness is the sensation of light-headedness but without the sensation of movement. Even though the Epley maneuver may relieve your vertigo, it is possible that your symptoms will return within 5 years. WHAT SHOULD I DO AFTER THIS MANEUVER? After doing the Epley maneuver, you can return to your normal activities. Ask your doctor if there is anything you should do at home to prevent vertigo. This may include:  Sleeping with two or more pillows to keep your head elevated.  Not sleeping on the side of your affected ear.  Getting up slowly from bed.  Avoiding sudden movements during the day.  Avoiding extreme head movement, like looking up or bending over.  Wearing a cervical collar to prevent sudden head movements. WHAT SHOULD I DO IF MY SYMPTOMS GET WORSE? Call your health care provider if your vertigo gets worse. Call your provider right way if you have other symptoms, including:   Nausea.  Vomiting.  Headache.  Weakness.  Numbness.  Vision changes. Document Released: 03/20/2013 Document Reviewed: 03/20/2013 [facility] Patient Information 2015 [place], [place]. This information is not intended to replace advice given to you by your health care provider. Make sure you discuss any questions you have with your health care provider.

## 2014-05-04 NOTE — ED Provider Notes (Signed)
CSN: [id number]     Arrival date & time 05/04/14  1455 History   First MD Initiated Contact with Patient 05/04/14 1540     Chief Complaint  Patient presents with  . Dizziness  . Nausea   (Consider location/radiation/quality/duration/timing/severity/associated sxs/prior Treatment) HPI         34 year old female presents complaining of dizziness and nausea. This started earlier today, she had acute onset of vertigo. She describes a sensation of the room spinning around her. It got worse up until about an hour ago when it has starting getting better. She has nausea without vomiting. She has no history of vertigo. She denies any other associated symptoms. No recent illness. It gets better when she stays very still and is worse with any movements of her head.  Past Medical History  Diagnosis Date  . Hiatal hernia   . Complication of anesthesia     feels panic with face mask  . Hypertension     previously on BP med - none currently  . Recurrent dry cough   . Headache(784.0)     hx migraines  . Sleep apnea     unable to tolerate mask   . Depression   . Anxiety     xanax prn   Past Surgical History  Procedure Laterality Date  . Breath tek h pylori N/A 07/20/2013    Procedure: BREATH TEK H PYLORI;  Surgeon: Kandis Cockingavid H Newman, MD;  [facility];  Service: General;  Laterality: N/A;  . Appendectomy    . Cholecystectomy    . Laparoscopic gastric sleeve resection N/A 09/18/2013    Procedure: LAPAROSCOPIC GASTRIC SLEEVE RESECTION UPPER ENDOSCOPY;  Surgeon: Kandis Cockingavid H Newman, MD;  Location: [facility];  Service: General;  Laterality: N/A;   No family history on file. History  Substance Use Topics  . Smoking status: Never Smoker   . Smokeless tobacco: Not on file  . Alcohol Use: Yes     Comment: occasional   OB History    No data available     Review of Systems  Gastrointestinal: Positive for nausea. Negative for vomiting.  Neurological: Positive for dizziness.  All other systems  reviewed and are negative.   Allergies  Review of patient's allergies indicates no known allergies.  Home Medications   Prior to Admission medications   Medication Sig Start Date End Date Taking? Authorizing Provider  ALPRAZolam (XANAX) 0.25 MG tablet Take 0.25 mg by mouth daily as needed.  06/07/13   Historical Provider, MD  AMETHYST 90-20 MCG tablet Take 1 tablet by mouth daily.  04/02/13   Historical Provider, MD  meclizine (ANTIVERT) 50 MG tablet Take 1 tablet (50 mg total) by mouth 3 (three) times daily as needed. 05/04/14   Graylon GoodZachary H Eagan Shifflett, PA-C  NASCOBAL 500 MCG/0.1ML SOLN  09/06/13   Historical Provider, MD   BP 134/92 mmHg  Pulse 63  Temp(Src) 98.1 F (36.7 C) (Oral)  Resp 16  SpO2 100% Physical Exam  Constitutional: She is oriented to person, place, and time. Vital signs are normal. She appears well-developed and well-nourished. No distress.  HENT:  Head: Normocephalic and atraumatic.  Cardiovascular: Normal rate, regular rhythm and normal heart sounds.   Pulmonary/Chest: Effort normal and breath sounds normal. No respiratory distress.  Neurological: She is alert and oriented to person, place, and time. She has normal strength and normal reflexes. No cranial nerve deficit or sensory deficit. She exhibits normal muscle tone. She displays a negative Romberg sign. Coordination and gait  normal. GCS eye subscore is 4. GCS verbal subscore is 5. GCS motor subscore is 6.  Heel toe gait is normal. Heel-to-shin is normal. Rapid alternating hand movements are normal. Finger to nose is normal.  Skin: Skin is warm and dry. No rash noted. She is not diaphoretic.  Psychiatric: She has a normal mood and affect. Judgment normal.  Nursing note and vitals reviewed.   ED Course  Procedures (including critical care time) Labs Review Labs Reviewed - No data to display  Imaging Review No results found.   MDM   1. Vertigo    Positional vertigo, treat with meclizine. ED if worsening.  Follow-up when necessary  Meds ordered this encounter  Medications  . meclizine (ANTIVERT) 50 MG tablet    Sig: Take 1 tablet (50 mg total) by mouth 3 (three) times daily as needed.    Dispense:  20 tablet    Refill:  0       Graylon Good, PA-C 05/04/14 1605

## 2014-05-04 NOTE — ED Notes (Signed)
Dizziness, spinning sensation.  Compared to being drunk or on a boat.  No recent illness.  Sudden onset of dizziness today, followed by nausea.  Movement of head increases dizziness.

## 2014-05-06 ENCOUNTER — Ambulatory Visit: Payer: BC Managed Care – PPO | Admitting: Dietician

## 2014-05-09 ENCOUNTER — Other Ambulatory Visit: Payer: Self-pay | Admitting: Family Medicine

## 2014-05-09 DIAGNOSIS — R42 Dizziness and giddiness: Secondary | ICD-10-CM

## 2014-05-10 ENCOUNTER — Ambulatory Visit
Admission: RE | Admit: 2014-05-10 | Discharge: 2014-05-10 | Disposition: A | Discharge status: Home / Self Care | Payer: BC Managed Care – PPO | Source: Ambulatory Visit | Attending: Family Medicine | Admitting: Family Medicine

## 2014-05-10 DIAGNOSIS — R42 Dizziness and giddiness: Secondary | ICD-10-CM

## 2015-12-21 IMAGING — CR DG CHEST 2V
2 series · 2 of 2 positions shown · non-contrast
Comparison: 11/24/2012

CLINICAL DATA: Hypertension.  Preoperative respiratory evaluation.

EXAM:
CHEST  2 VIEW

[w chest pa]
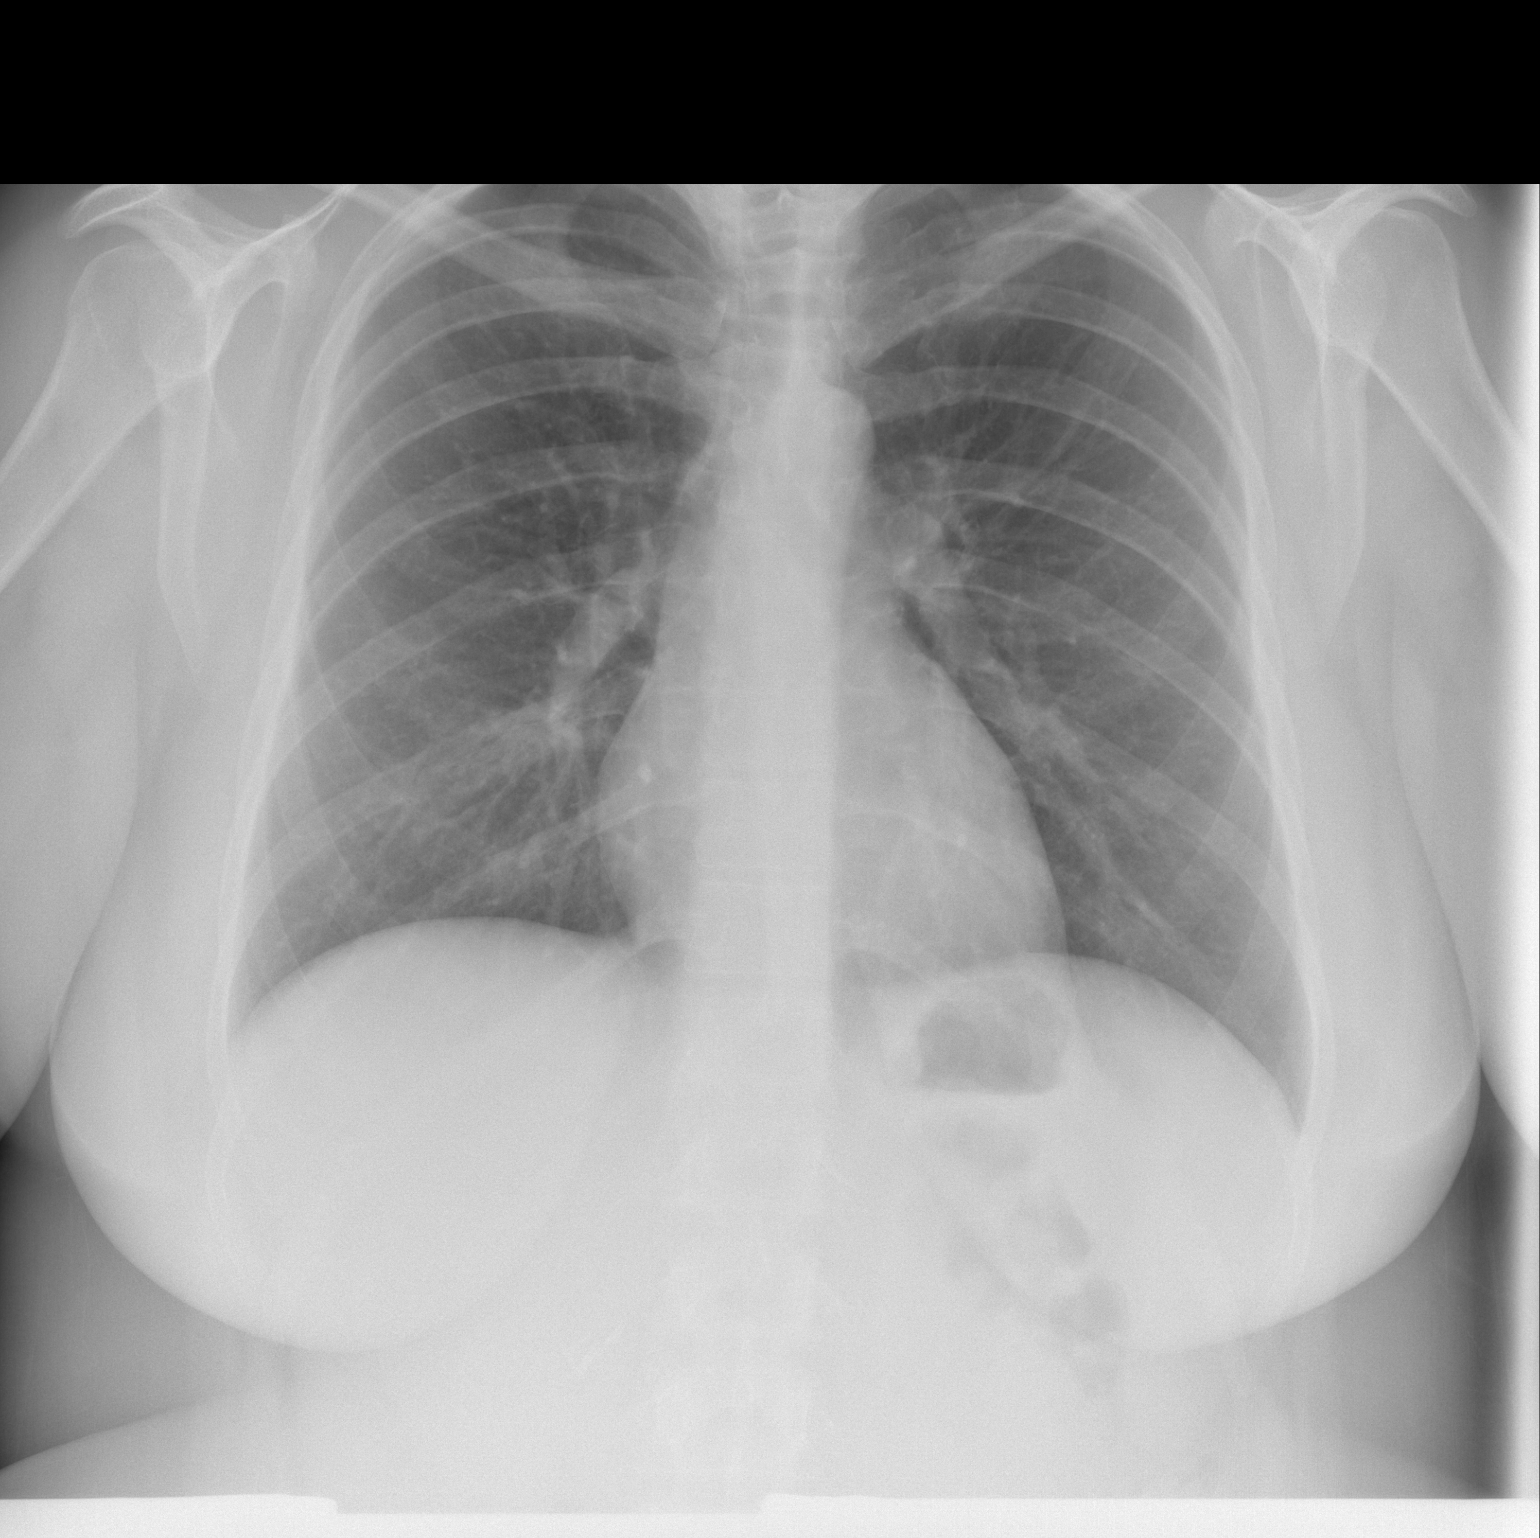

[w chest lat]
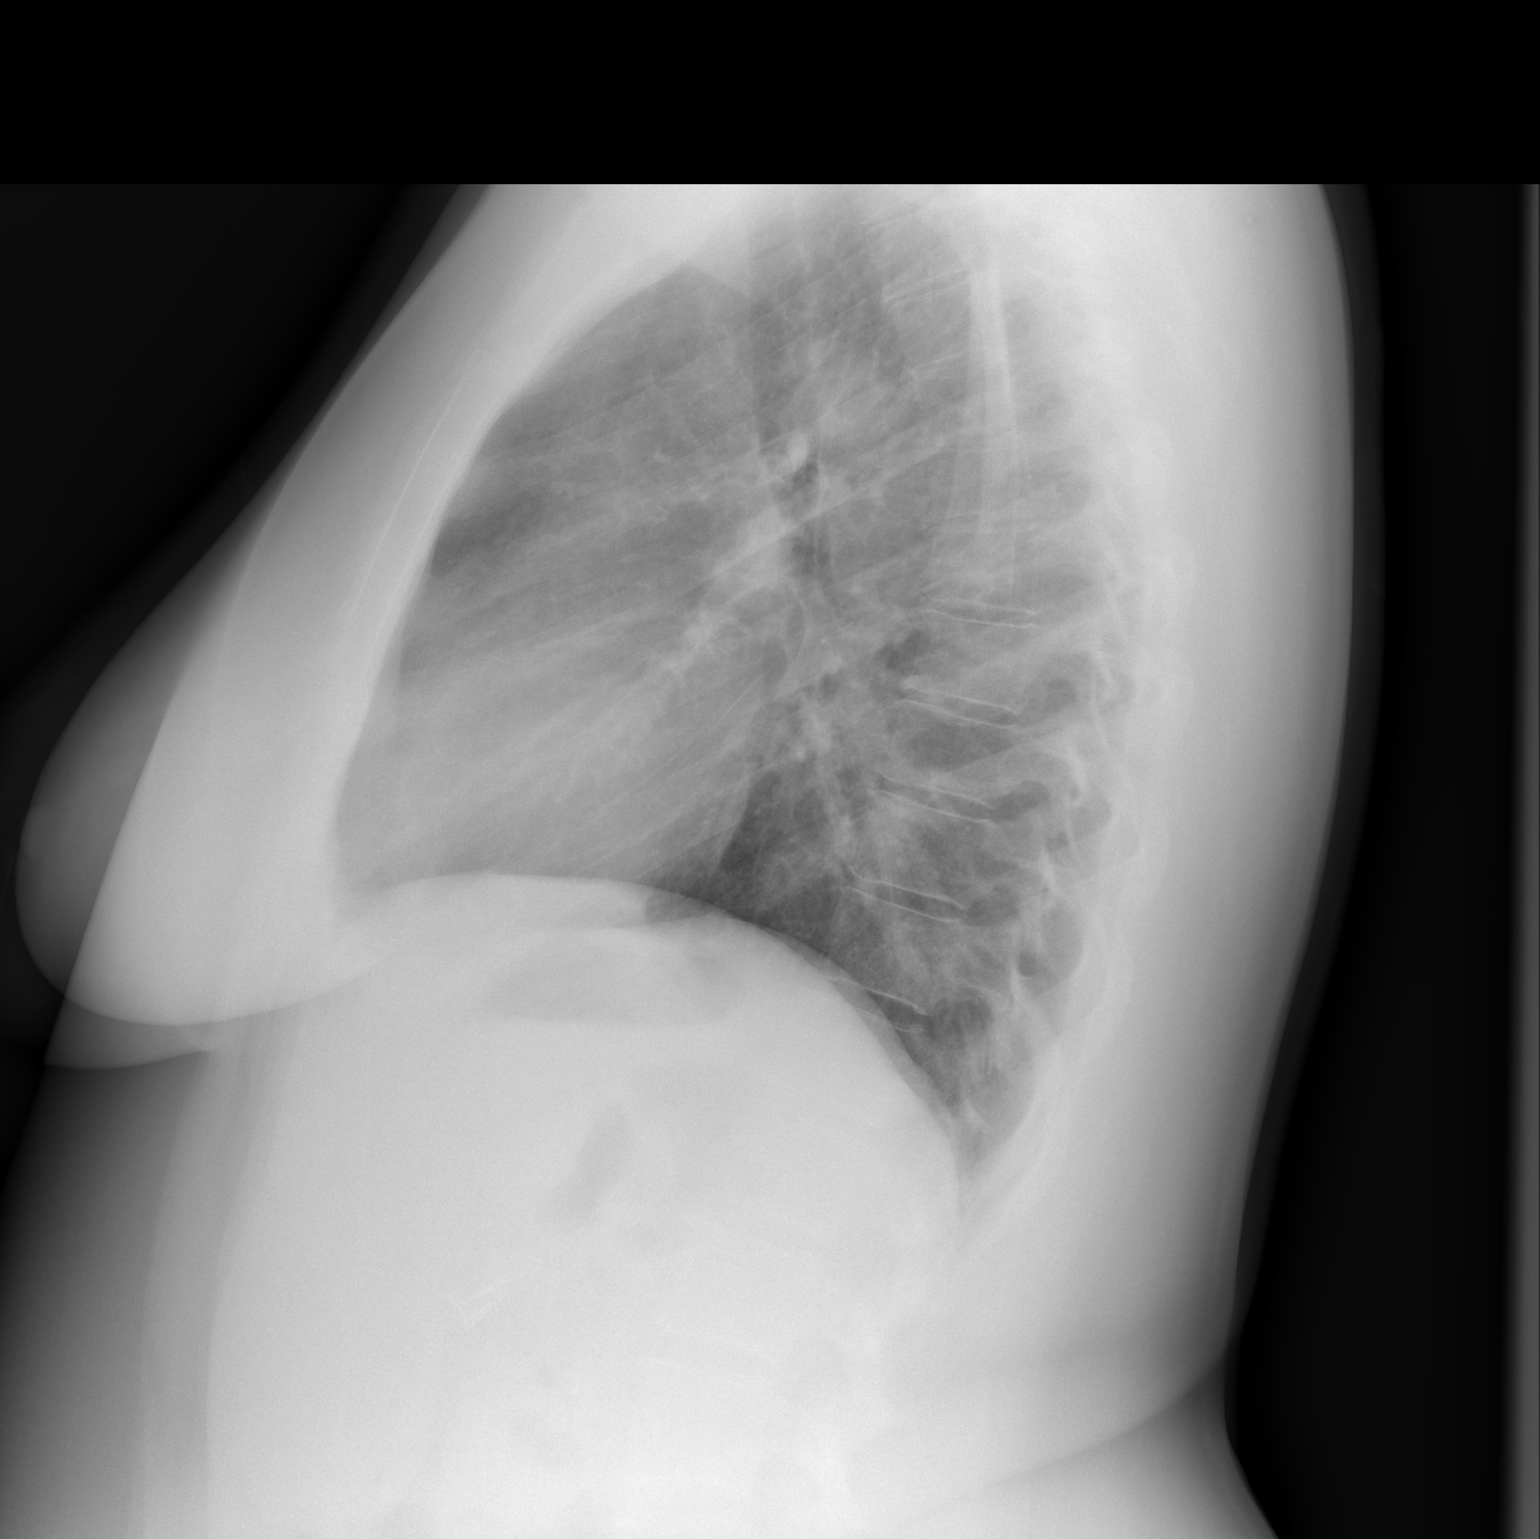

[2 of 2 positions shown; findings below may reference images not displayed]

FINDINGS: The heart size and mediastinal contours are within normal limits.
Both lungs are clear. The visualized skeletal structures are
unremarkable.
IMPRESSION: Normal exam.

## 2016-01-27 ENCOUNTER — Encounter (HOSPITAL_COMMUNITY): Payer: Self-pay

## 2017-01-25 ENCOUNTER — Other Ambulatory Visit: Payer: Self-pay | Admitting: Family Medicine

## 2017-01-25 ENCOUNTER — Other Ambulatory Visit (HOSPITAL_COMMUNITY)
Admission: RE | Admit: 2017-01-25 | Discharge: 2017-01-25 | Disposition: A | Discharge status: Home / Self Care | Payer: BC Managed Care – PPO | Source: Ambulatory Visit | Attending: Family Medicine | Admitting: Family Medicine

## 2017-01-25 DIAGNOSIS — Z124 Encounter for screening for malignant neoplasm of cervix: Secondary | ICD-10-CM | POA: Diagnosis not present

## 2017-01-31 LAB — CYTOLOGY - PAP
DIAGNOSIS: UNDETERMINED — AB
HPV: NOT DETECTED

## 2018-05-03 ENCOUNTER — Other Ambulatory Visit: Payer: Self-pay | Admitting: Family Medicine

## 2018-05-03 ENCOUNTER — Other Ambulatory Visit (HOSPITAL_COMMUNITY)
Admission: RE | Admit: 2018-05-03 | Discharge: 2018-05-03 | Disposition: A | Discharge status: Home / Self Care | Payer: BC Managed Care – PPO | Source: Ambulatory Visit | Attending: Family Medicine | Admitting: Family Medicine

## 2018-05-03 DIAGNOSIS — R87610 Atypical squamous cells of undetermined significance on cytologic smear of cervix (ASC-US): Secondary | ICD-10-CM | POA: Insufficient documentation

## 2018-05-09 LAB — CYTOLOGY - PAP
Diagnosis: UNDETERMINED — AB
HPV (WINDOPATH): NOT DETECTED

## 2018-05-23 DIAGNOSIS — R87619 Unspecified abnormal cytological findings in specimens from cervix uteri: Secondary | ICD-10-CM | POA: Insufficient documentation

## 2019-06-21 ENCOUNTER — Ambulatory Visit (INDEPENDENT_AMBULATORY_CARE_PROVIDER_SITE_OTHER): Payer: BC Managed Care – PPO

## 2019-06-21 ENCOUNTER — Encounter: Payer: Self-pay | Admitting: Family Medicine

## 2019-06-21 ENCOUNTER — Other Ambulatory Visit: Payer: Self-pay

## 2019-06-21 ENCOUNTER — Ambulatory Visit: Payer: BC Managed Care – PPO | Admitting: Family Medicine

## 2019-06-21 VITALS — BP 130/86 | HR 82 | Temp 98.2°F | Resp 18 | Ht 67.5 in | Wt 265.0 lb

## 2019-06-21 DIAGNOSIS — F419 Anxiety disorder, unspecified: Secondary | ICD-10-CM | POA: Diagnosis not present

## 2019-06-21 DIAGNOSIS — R05 Cough: Secondary | ICD-10-CM

## 2019-06-21 DIAGNOSIS — R053 Chronic cough: Secondary | ICD-10-CM

## 2019-06-21 DIAGNOSIS — F329 Major depressive disorder, single episode, unspecified: Secondary | ICD-10-CM

## 2019-06-21 DIAGNOSIS — F32A Depression, unspecified: Secondary | ICD-10-CM

## 2019-06-21 DIAGNOSIS — Z903 Acquired absence of stomach [part of]: Secondary | ICD-10-CM | POA: Diagnosis not present

## 2019-06-21 DIAGNOSIS — Z6841 Body Mass Index (BMI) 40.0 and over, adult: Secondary | ICD-10-CM | POA: Diagnosis not present

## 2019-06-21 MED ORDER — FLUTICASONE PROPIONATE 50 MCG/ACT NA SUSP: 1.0000 | Freq: Two times a day (BID) | NASAL | 6 refills | Status: DC

## 2019-06-21 MED ORDER — BUSPIRONE HCL 5 MG PO TABS: 5.0000 mg | ORAL_TABLET | Freq: Three times a day (TID) | ORAL | 2 refills | Status: DC

## 2019-06-21 NOTE — Progress Notes (Signed)
3/25/202111:40 AM  [name] 28-Apr-1980, 39 y.o., female [id number]  Chief Complaint  Patient presents with  . Insomnia    cant sleep longer than 4 hours  . depression/ anxiety    phq9/ gad 7 given  . coughing    30 mins per day x 6 months  . b12 medications    pt states forgets to take sublingual liquid often/ states shots may be better    HPI:   Patient is a 39 y.o. female with past medical history significant for anxiety and depression, OSA not cpap, s/p gastric sleeve, migraines who presents today to establish  Previous PCP Dr Kelton Pillar Last OV 6 months ago  Depression and anxiety since childhood No hospitalization Has never seen a psychiatrist  Saw grief counselor when her father died in 07/29/14 Currently on pristiq, has been on it for over a year In the past tried: zoloft - very blunting paxil - cant remember why stopped prozac - has been on and off the most, helped mostly with depression but not with irritability and anxiety wellbutrin - tried when her father died, did not "phase me at all, but that was one of the darkest time of my life" celexa - cant remember why stopped She continues to struggle with irritability and anxiety She takes xanax prn - pmp reviewed In the past she has never felt like depression has been an issue except when her father died (this past year though she has struggled with depression) Her main trigger is her son who has ADHD, 10yo Her oldest son, 15yo, who used to be a straight A student is now failing since remote learning started - this is very stressful My "husband is a saint" Has a feeling that there was childhood trauma, cringes when a particular uncle's name is mentioned, her mother was mad that she would think that a possibility PHQ9 and GAD 7 noted MDQ9 = 6 yes on part 1, yes part 2, moderate part 3, yes part 4 (sister), no part 5  Gastric sleeve in 07/28/13, max weight 280-285 lbs Lowest 197lbs Was exercising and then her  father had a traumatic death She coped by drinking - gained weight Does not drink anymore Struggling with motivation for exercising She still follows diet given for bariatric surgery  Migraines - dx when a teenager Used to be on imitrex, etc Migraines are not very frequent anymore Unilateral, with phonophobia and photophobia She tends to get tension and sinus headaches Takes tylenol or ibuprofen prn  Having chronic cough daily x 6 months, mostly when she talks At times productive greenish mucous, no blood Denies any PND, reflux, wheezing, SOB, fever, chills, nightsweats, no hoarseness She does not smoke Denies any h/o asthma Used to get lots of "bronchitis"  OSA - dx prior to bariatric surgery Used to snore very loudly With weight loss no more snoring She did not tolerate mask due to anxiety Still suffers from insomnia   Depression screen [facility] 2/9 06/21/2019  Decreased Interest 3  Down, Depressed, Hopeless 2  PHQ - 2 Score 5  Altered sleeping 3  Tired, decreased energy 3  Change in appetite 3  Feeling bad or failure about yourself  1  Trouble concentrating 1  Moving slowly or fidgety/restless 0  Suicidal thoughts 0  PHQ-9 Score 16  Difficult doing work/chores Very difficult   GAD 7 : Generalized Anxiety Score 06/21/2019  Nervous, Anxious, on Edge 1  Control/stop worrying 1  Worry too much - different  things 1  Trouble relaxing 3  Restless 1  Easily annoyed or irritable 3  Afraid - awful might happen 0  Total GAD 7 Score 10  Anxiety Difficulty Very difficult     Fall Risk  06/21/2019  Falls in the past year? 0  Number falls in past yr: 0  Injury with Fall? 0  Follow up Falls evaluation completed     No Known Allergies  Prior to Admission medications   Medication Sig Start Date End Date Taking? Authorizing Provider  ALPRAZolam Prudy Feeler) 0.5 MG tablet Take 0.5 mg by mouth 2 (two) times daily as needed. 03/05/19  Yes [provider]  AMETHYST 90-20 MCG  tablet Take 1 tablet by mouth daily.  04/02/13  Yes [provider]  meclizine (ANTIVERT) 50 MG tablet Take 1 tablet (50 mg total) by mouth 3 (three) times daily as needed. 05/04/14  Yes Baker, Zachary H, PA-C  NASCOBAL 500 MCG/0.1ML SOLN  09/06/13  Yes [provider]  SPRINTEC 28 0.25-35 MG-MCG tablet Take 1 tablet by mouth daily. 05/08/19  Yes [provider]  ALPRAZolam (XANAX) 0.25 MG tablet Take 0.25 mg by mouth daily as needed.  06/07/13   [provider]  desvenlafaxine (PRISTIQ) 100 MG 24 hr tablet Take 100 mg by mouth daily. 04/03/19   [provider]    Past Medical History:  Diagnosis Date  . Anxiety    xanax prn  . Complication of anesthesia    feels panic with face mask  . Depression   . Headache(784.0)    hx migraines  . Hiatal hernia   . Hypertension    previously on BP med - none currently  . Recurrent dry cough   . Sleep apnea    unable to tolerate mask     Past Surgical History:  Procedure Laterality Date  . APPENDECTOMY    . BREATH TEK H PYLORI N/A 07/20/2013   Procedure: BREATH TEK H PYLORI;  Surgeon: Kandis Cocking, MD;  [facility];  Service: General;  Laterality: N/A;  . CHOLECYSTECTOMY    . LAPAROSCOPIC GASTRIC SLEEVE RESECTION N/A 09/18/2013   Procedure: LAPAROSCOPIC GASTRIC SLEEVE RESECTION UPPER ENDOSCOPY;  Surgeon: Kandis Cocking, MD;  Location: [facility];  Service: General;  Laterality: N/A;    Social History   Tobacco Use  . Smoking status: Never Smoker  . Smokeless tobacco: Never Used  Substance Use Topics  . Alcohol use: Yes    Comment: occasional    Family History  Problem Relation Age of Onset  . Depression Mother   . Kidney disease Mother   . Liver disease Mother   . Diverticulitis Mother   . Heart failure Father   . Diabetes Father   . Psychiatric Illness Sister     Review of Systems  Constitutional: Negative for chills and fever.  Respiratory: Positive for cough and sputum  production. Negative for hemoptysis, shortness of breath and wheezing.   Cardiovascular: Negative for chest pain, palpitations and leg swelling.  Gastrointestinal: Negative for abdominal pain, nausea and vomiting.   Per hpi  OBJECTIVE:  Today's Vitals   06/21/19 1111 06/21/19 1128  BP: (!) 141/93 130/86  Pulse: 82   Resp: 18   Temp: 98.2 F (36.8 C)   SpO2: 96%   Weight: 265 lb (120.2 kg)   Height: 5' 7.5" (1.715 m)    Body mass index is 40.89 kg/m.   Physical Exam Vitals and nursing note reviewed.  Constitutional:  Appearance: She is well-developed.  HENT:     Head: Normocephalic and atraumatic.     Right Ear: Hearing, tympanic membrane, ear canal and external ear normal.     Left Ear: Hearing, tympanic membrane, ear canal and external ear normal.     Nose: Rhinorrhea present. No congestion.     Mouth/Throat:     Pharynx: No oropharyngeal exudate or posterior oropharyngeal erythema.  Eyes:     Extraocular Movements: Extraocular movements intact.     Conjunctiva/sclera: Conjunctivae normal.     Pupils: Pupils are equal, round, and reactive to light.  Cardiovascular:     Rate and Rhythm: Normal rate and regular rhythm.     Heart sounds: Normal heart sounds. No murmur. No friction rub. No gallop.   Pulmonary:     Effort: Pulmonary effort is normal.     Breath sounds: Normal breath sounds. No wheezing, rhonchi or rales.  Musculoskeletal:     Cervical back: Neck supple.  Lymphadenopathy:     Cervical: No cervical adenopathy.  Skin:    General: Skin is warm and dry.  Neurological:     Mental Status: She is alert and oriented to person, place, and time.  Psychiatric:        Attention and Perception: She does not perceive auditory or visual hallucinations.        Mood and Affect: Mood is anxious. Affect is flat.        Speech: Speech normal.        Behavior: Behavior normal.        Thought Content: Thought content is not paranoid. Thought content does not include  suicidal ideation.        Cognition and Memory: Cognition and memory normal.     No results found for this or any previous visit (from the past 24 hour(s)).  No results found.   ASSESSMENT and PLAN  1. Anxiety and depression Not well controlled. mdq9 negative. Augmenting with buspar. Reviewed r/se/b. Consider low dose gabapentin or abilify given sign irritability. Consider referring to psych. Referring for counseling.  - Ambulatory referral to Psychology  2. Chronic cough Normal exam, cxr per my read normal, official read pending. Trial of flonase for possible PND, consider ENT referral. - DG Chest 2 View  3. 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 Discussed exercise, cont diet. - CBC - Comprehensive metabolic panel - Ferritin - Vitamin D, 25-hydroxy - Vitamin B12  4. BMI 40.0-44.9, adult (HCC) - TSH - Lipid panel  Other orders  - fluticasone (FLONASE) 50 MCG/ACT nasal spray; Place 1 spray into both nostrils 2 (two) times daily. - busPIRone (BUSPAR) 5 MG tablet; Take 1 tablet (5 mg total) by mouth 3 (three) times daily.  Return in about 4 weeks (around 07/19/2019).    Myles Lipps, MD Primary Care at [facility] [address] [place], [place] [address] Ph.  [phone] Fax [phone]

## 2019-06-21 NOTE — Patient Instructions (Addendum)
For therapy -- 1. [facility] [phone] 2. [facility]  - [phone] 3. [facility] - [phone] 4. [facility] - [phone] (do not file insurance) 5. [facility] - accepts most major insurances. [phone] or email [email or web address]    If you have lab work done today you will be contacted with your lab results within the next 2 weeks.  If you have not heard from [place] then please contact us. The fastest way to get your results is to register for My Chart.   IF you received an x-ray today, you will receive an invoice from [facility]. Please contact [facility] at [phone] with questions or concerns regarding your invoice.   IF you received labwork today, you will receive an invoice from [place]. Please contact [facility] at [phone] with questions or concerns regarding your invoice.   Our billing staff will not be able to assist you with questions regarding bills from these companies.  You will be contacted with the lab results as soon as they are available. The fastest way to get your results is to activate your My Chart account. Instructions are located on the last page of this paperwork. If you have not heard from [place] regarding the results in 2 weeks, please contact this office.

## 2019-06-22 ENCOUNTER — Telehealth: Payer: Self-pay

## 2019-06-22 LAB — COMPREHENSIVE METABOLIC PANEL
ALT: 25 IU/L (ref 0–32)
AST: 27 IU/L (ref 0–40)
Albumin/Globulin Ratio: 1.4 (ref 1.2–2.2)
Albumin: 4.3 g/dL (ref 3.8–4.8)
Alkaline Phosphatase: 179 IU/L — ABNORMAL HIGH (ref 39–117)
BUN/Creatinine Ratio: 9 (ref 9–23)
BUN: 7 mg/dL (ref 6–24)
Bilirubin Total: 0.5 mg/dL (ref 0.0–1.2)
CO2: 20 mmol/L (ref 20–29)
Calcium: 9.5 mg/dL (ref 8.7–10.2)
Chloride: 103 mmol/L (ref 96–106)
Creatinine, Ser: 0.75 mg/dL (ref 0.57–1.00)
GFR calc Af Amer: 115 mL/min/{1.73_m2} (ref >59)
GFR calc non Af Amer: 100 mL/min/{1.73_m2} (ref >59)
Globulin, Total: 3.1 g/dL (ref 1.5–4.5)
Glucose: 85 mg/dL (ref 65–99)
Potassium: 4.5 mmol/L (ref 3.5–5.2)
Sodium: 138 mmol/L (ref 134–144)
Total Protein: 7.4 g/dL (ref 6.0–8.5)

## 2019-06-22 LAB — VITAMIN B12: Vitamin B-12: 491 pg/mL (ref 232–1245)

## 2019-06-22 LAB — TSH: TSH: 1.33 u[IU]/mL (ref 0.450–4.500)

## 2019-06-22 LAB — LIPID PANEL
Chol/HDL Ratio: 4.2 ratio (ref 0.0–4.4)
Cholesterol, Total: 283 mg/dL — ABNORMAL HIGH (ref 100–199)
HDL: 68 mg/dL (ref >39)
LDL Chol Calc (NIH): 176 mg/dL — ABNORMAL HIGH (ref 0–99)
Triglycerides: 209 mg/dL — ABNORMAL HIGH (ref 0–149)
VLDL Cholesterol Cal: 39 mg/dL (ref 5–40)

## 2019-06-22 LAB — CBC
Hematocrit: 39.5 % (ref 34.0–46.6)
Hemoglobin: 13.1 g/dL (ref 11.1–15.9)
MCH: 29.2 pg (ref 26.6–33.0)
MCHC: 33.2 g/dL (ref 31.5–35.7)
MCV: 88 fL (ref 79–97)
Platelets: 390 10*3/uL (ref 150–450)
RBC: 4.48 x10E6/uL (ref 3.77–5.28)
RDW: 11.9 % (ref 11.7–15.4)
WBC: 8.3 10*3/uL (ref 3.4–10.8)

## 2019-06-22 LAB — VITAMIN D 25 HYDROXY (VIT D DEFICIENCY, FRACTURES): Vit D, 25-Hydroxy: 23.7 ng/mL — ABNORMAL LOW (ref 30.0–100.0)

## 2019-06-22 LAB — FERRITIN: Ferritin: 69 ng/mL (ref 15–150)

## 2019-06-27 ENCOUNTER — Telehealth: Payer: Self-pay | Admitting: Family Medicine

## 2019-06-27 NOTE — Telephone Encounter (Signed)
I called [name] concerning a question about the release she filled out on 06/21/19. I need to know-where does she want her medical info to be sent to and from where?

## 2019-07-13 ENCOUNTER — Other Ambulatory Visit: Payer: Self-pay | Admitting: Family Medicine

## 2019-07-19 ENCOUNTER — Ambulatory Visit: Payer: BC Managed Care – PPO | Admitting: Family Medicine

## 2019-08-06 ENCOUNTER — Encounter: Payer: Self-pay | Admitting: Family Medicine

## 2019-08-06 ENCOUNTER — Ambulatory Visit: Payer: BC Managed Care – PPO | Admitting: Family Medicine

## 2019-08-06 ENCOUNTER — Other Ambulatory Visit: Payer: Self-pay

## 2019-08-06 VITALS — BP 140/100 | HR 91 | Temp 98.0°F | Ht 67.5 in | Wt 264.0 lb

## 2019-08-06 DIAGNOSIS — E559 Vitamin D deficiency, unspecified: Secondary | ICD-10-CM

## 2019-08-06 DIAGNOSIS — F419 Anxiety disorder, unspecified: Secondary | ICD-10-CM | POA: Diagnosis not present

## 2019-08-06 DIAGNOSIS — Z6841 Body Mass Index (BMI) 40.0 and over, adult: Secondary | ICD-10-CM

## 2019-08-06 DIAGNOSIS — R030 Elevated blood-pressure reading, without diagnosis of hypertension: Secondary | ICD-10-CM | POA: Diagnosis not present

## 2019-08-06 DIAGNOSIS — F329 Major depressive disorder, single episode, unspecified: Secondary | ICD-10-CM

## 2019-08-06 NOTE — Patient Instructions (Addendum)
1700 calories a day  Calorie Counting for Weight Loss Calories are units of energy. Your body needs a certain amount of calories from food to keep you going throughout the day. When you eat more calories than your body needs, your body stores the extra calories as fat. When you eat fewer calories than your body needs, your body burns fat to get the energy it needs. Calorie counting means keeping track of how many calories you eat and drink each day. Calorie counting can be helpful if you need to lose weight. If you make sure to eat fewer calories than your body needs, you should lose weight. Ask your health care provider what a healthy weight is for you. For calorie counting to work, you will need to eat the right number of calories in a day in order to lose a healthy amount of weight per week. A dietitian can help you determine how many calories you need in a day and will give you suggestions on how to reach your calorie goal.  A healthy amount of weight to lose per week is usually 1-2 lb (0.5-0.9 kg). This usually means that your daily calorie intake should be reduced by 500-750 calories.  Eating 1,200 - 1,500 calories per day can help most women lose weight.  Eating 1,500 - 1,800 calories per day can help most men lose weight. What is my plan? My goal is to have __________ calories per day. If I have this many calories per day, I should lose around __________ pounds per week. What do I need to know about calorie counting? In order to meet your daily calorie goal, you will need to:  Find out how many calories are in each food you would like to eat. Try to do this before you eat.  Decide how much of the food you plan to eat.  Write down what you ate and how many calories it had. Doing this is called keeping a food log. To successfully lose weight, it is important to balance calorie counting with a healthy lifestyle that includes regular activity. Aim for 150 minutes of moderate exercise (such  as walking) or 75 minutes of vigorous exercise (such as running) each week. Where do I find calorie information?  The number of calories in a food can be found on a Nutrition Facts label. If a food does not have a Nutrition Facts label, try to look up the calories online or ask your dietitian for help. Remember that calories are listed per serving. If you choose to have more than one serving of a food, you will have to multiply the calories per serving by the amount of servings you plan to eat. For example, the label on a package of bread might say that a serving size is 1 slice and that there are 90 calories in a serving. If you eat 1 slice, you will have eaten 90 calories. If you eat 2 slices, you will have eaten 180 calories. How do I keep a food log? Immediately after each meal, record the following information in your food log:  What you ate. Don't forget to include toppings, sauces, and other extras on the food.  How much you ate. This can be measured in cups, ounces, or number of items.  How many calories each food and drink had.  The total number of calories in the meal. Keep your food log near you, such as in a small notebook in your pocket, or use a mobile app or  website. Some programs will calculate calories for you and show you how many calories you have left for the day to meet your goal. What are some calorie counting tips?   Use your calories on foods and drinks that will fill you up and not leave you hungry: ? Some examples of foods that fill you up are nuts and nut butters, vegetables, lean proteins, and high-fiber foods like whole grains. High-fiber foods are foods with more than 5 g fiber per serving. ? Drinks such as sodas, specialty coffee drinks, alcohol, and juices have a lot of calories, yet do not fill you up.  Eat nutritious foods and avoid empty calories. Empty calories are calories you get from foods or beverages that do not have many vitamins or protein, such as  candy, sweets, and soda. It is better to have a nutritious high-calorie food (such as an avocado) than a food with few nutrients (such as a bag of chips).  Know how many calories are in the foods you eat most often. This will help you calculate calorie counts faster.  Pay attention to calories in drinks. Low-calorie drinks include water and unsweetened drinks.  Pay attention to nutrition labels for "low fat" or "fat free" foods. These foods sometimes have the same amount of calories or more calories than the full fat versions. They also often have added sugar, starch, or salt, to make up for flavor that was removed with the fat.  Find a way of tracking calories that works for you. Get creative. Try different apps or programs if writing down calories does not work for you. What are some portion control tips?  Know how many calories are in a serving. This will help you know how many servings of a certain food you can have.  Use a measuring cup to measure serving sizes. You could also try weighing out portions on a kitchen scale. With time, you will be able to estimate serving sizes for some foods.  Take some time to put servings of different foods on your favorite plates, bowls, and cups so you know what a serving looks like.  Try not to eat straight from a bag or box. Doing this can lead to overeating. Put the amount you would like to eat in a cup or on a plate to make sure you are eating the right portion.  Use smaller plates, glasses, and bowls to prevent overeating.  Try not to multitask (for example, watch TV or use your computer) while eating. If it is time to eat, sit down at a table and enjoy your food. This will help you to know when you are full. It will also help you to be aware of what you are eating and how much you are eating. What are tips for following this plan? Reading food labels  Check the calorie count compared to the serving size. The serving size may be smaller than what  you are used to eating.  Check the source of the calories. Make sure the food you are eating is high in vitamins and protein and low in saturated and trans fats. Shopping  Read nutrition labels while you shop. This will help you make healthy decisions before you decide to purchase your food.  Make a grocery list and stick to it. Cooking  Try to cook your favorite foods in a healthier way. For example, try baking instead of frying.  Use low-fat dairy products. Meal planning  Use more fruits and vegetables. Half of  your plate should be fruits and vegetables.  Include lean proteins like poultry and fish. How do I count calories when eating out?  Ask for smaller portion sizes.  Consider sharing an entree and sides instead of getting your own entree.  If you get your own entree, eat only half. Ask for a box at the beginning of your meal and put the rest of your entree in it so you are not tempted to eat it.  If calories are listed on the menu, choose the lower calorie options.  Choose dishes that include vegetables, fruits, whole grains, low-fat dairy products, and lean protein.  Choose items that are boiled, broiled, grilled, or steamed. Stay away from items that are buttered, battered, fried, or served with cream sauce. Items labeled "crispy" are usually fried, unless stated otherwise.  Choose water, low-fat milk, unsweetened iced tea, or other drinks without added sugar. If you want an alcoholic beverage, choose a lower calorie option such as a glass of wine or light beer.  Ask for dressings, sauces, and syrups on the side. These are usually high in calories, so you should limit the amount you eat.  If you want a salad, choose a garden salad and ask for grilled meats. Avoid extra toppings like bacon, cheese, or fried items. Ask for the dressing on the side, or ask for olive oil and vinegar or lemon to use as dressing.  Estimate how many servings of a food you are given. For  example, a serving of cooked rice is  cup or about the size of half a baseball. Knowing serving sizes will help you be aware of how much food you are eating at restaurants. The list below tells you how big or small some common portion sizes are based on everyday objects: ? 1 oz--4 stacked dice. ? 3 oz--1 deck of cards. ? 1 tsp--1 die. ? 1 Tbsp-- a ping-pong ball. ? 2 Tbsp--1 ping-pong ball. ?  cup-- baseball. ? 1 cup--1 baseball. Summary  Calorie counting means keeping track of how many calories you eat and drink each day. If you eat fewer calories than your body needs, you should lose weight.  A healthy amount of weight to lose per week is usually 1-2 lb (0.5-0.9 kg). This usually means reducing your daily calorie intake by 500-750 calories.  The number of calories in a food can be found on a Nutrition Facts label. If a food does not have a Nutrition Facts label, try to look up the calories online or ask your dietitian for help.  Use your calories on foods and drinks that will fill you up, and not on foods and drinks that will leave you hungry.  Use smaller plates, glasses, and bowls to prevent overeating. This information is not intended to replace advice given to you by your health care provider. Make sure you discuss any questions you have with your health care provider. Document Revised: 12/02/2017 Document Reviewed: 02/13/2016 Elsevier Patient Education  [employer].     If you have lab work done today you will be contacted with your lab results within the next 2 weeks.  If you have not heard from [place] then please contact us. The fastest way to get your results is to register for My Chart.   IF you received an x-ray today, you will receive an invoice from [facility]. Please contact [facility] at [phone] with questions or concerns regarding your invoice.   IF you received labwork today, you will  receive an invoice from [employer]. Please contact  [facility] at [phone] with questions or concerns regarding your invoice.   Our billing staff will not be able to assist you with questions regarding bills from these companies.  You will be contacted with the lab results as soon as they are available. The fastest way to get your results is to activate your My Chart account. Instructions are located on the last page of this paperwork. If you have not heard from [place] regarding the results in 2 weeks, please contact this office.

## 2019-08-06 NOTE — Telephone Encounter (Signed)
No ation required at this time, closing encounter from inbox 

## 2019-08-06 NOTE — Progress Notes (Signed)
5/10/202111:37 AM  [name] Oct 27, 1980, 39 y.o., female [id number]  Chief Complaint  Patient presents with  . Weight Gain    HPI:   Patient is a 39 y.o. female with past medical history significant for anxiety and depression, OSA not cpap, s/p gastric sleeve, migraines  who presents today for routine followup  Last OV march 2021 - trial of buspar and flonase, referred to counseling, start vitamin D 2000 daily  She reports mood sign improved on current regime "its the happiest I have been" phq9 and gad7 noted More active, taking care of chickens and pig Has a veggie and flower garden Continues to struggle with exercise and diet Has noticed increased in portion size Tends to start eating in the evening, usually not hungry during the day She does not like to cook  GAD 7 : Generalized Anxiety Score 08/06/2019 06/21/2019  Nervous, Anxious, on Edge 1 1  Control/stop worrying 0 1  Worry too much - different things 1 1  Trouble relaxing 0 3  Restless 0 1  Easily annoyed or irritable 1 3  Afraid - awful might happen 0 0  Total GAD 7 Score 3 10  Anxiety Difficulty Not difficult at all Very difficult     Depression screen [facility] 2/9 08/06/2019 06/21/2019  Decreased Interest 0 3  Down, Depressed, Hopeless 0 2  PHQ - 2 Score 0 5  Altered sleeping 1 3  Tired, decreased energy 1 3  Change in appetite 1 3  Feeling bad or failure about yourself  0 1  Trouble concentrating 0 1  Moving slowly or fidgety/restless 1 0  Suicidal thoughts 0 0  PHQ-9 Score 4 16  Difficult doing work/chores Not difficult at all Very difficult    Fall Risk  08/06/2019 06/21/2019  Falls in the past year? 0 0  Number falls in past yr: 0 0  Injury with Fall? 0 0  Follow up Falls evaluation completed Falls evaluation completed     No Known Allergies  Prior to Admission medications   Medication Sig Start Date End Date Taking? Authorizing Provider  ALPRAZolam Prudy Feeler) 0.5 MG tablet Take 0.5 mg by mouth 2  (two) times daily as needed. 03/05/19  Yes [provider]  busPIRone (BUSPAR) 5 MG tablet TAKE 1 TABLET BY MOUTH THREE TIMES A DAY 07/13/19  Yes Myles Lipps, MD  desvenlafaxine (PRISTIQ) 100 MG 24 hr tablet Take 100 mg by mouth daily. 04/03/19  Yes [provider]  fluticasone (FLONASE) 50 MCG/ACT nasal spray Place 1 spray into both nostrils 2 (two) times daily. 06/21/19  Yes Myles Lipps, MD  SPRINTEC 28 0.25-35 MG-MCG tablet Take 1 tablet by mouth daily. 05/08/19  Yes [provider]    Past Medical History:  Diagnosis Date  . Anxiety    xanax prn  . Complication of anesthesia    feels panic with face mask  . Depression   . Headache(784.0)    hx migraines  . Hiatal hernia   . Hypertension    previously on BP med - none currently  . Recurrent dry cough   . Sleep apnea    unable to tolerate mask     Past Surgical History:  Procedure Laterality Date  . APPENDECTOMY    . BREATH TEK H PYLORI N/A 07/20/2013   Procedure: BREATH TEK H PYLORI;  Surgeon: Kandis Cocking, MD;  Location: [facility];  Service: General;  Laterality: N/A;  . CHOLECYSTECTOMY    . LAPAROSCOPIC  GASTRIC SLEEVE RESECTION N/A 09/18/2013   Procedure: LAPAROSCOPIC GASTRIC SLEEVE RESECTION UPPER ENDOSCOPY;  Surgeon: Shann Medal, MD;  Location: [facility];  Service: General;  Laterality: N/A;    Social History   Tobacco Use  . Smoking status: Never Smoker  . Smokeless tobacco: Never Used  Substance Use Topics  . Alcohol use: Yes    Comment: occasional    Family History  Problem Relation Age of Onset  . Depression Mother   . Kidney disease Mother   . Liver disease Mother   . Diverticulitis Mother   . Heart failure Father   . Diabetes Father   . Psychiatric Illness Sister     ROS Per hpi  OBJECTIVE:  Today's Vitals   08/06/19 1131  BP: (!) 133/91  Pulse: 91  Temp: 98 F (36.7 C)  SpO2: 98%  Weight: 264 lb (119.7 kg)  Height: 5' 7.5" (1.715 m)   Body mass index  is 40.74 kg/m.  Wt Readings from Last 3 Encounters:  08/06/19 264 lb (119.7 kg)  06/21/19 265 lb (120.2 kg)  02/18/14 211 lb 8 oz (95.9 kg)    Physical Exam Vitals and nursing note reviewed.  Constitutional:      Appearance: She is well-developed.  HENT:     Head: Normocephalic and atraumatic.  Eyes:     General: No scleral icterus.    Conjunctiva/sclera: Conjunctivae normal.     Pupils: Pupils are equal, round, and reactive to light.  Pulmonary:     Effort: Pulmonary effort is normal.  Musculoskeletal:     Cervical back: Neck supple.  Skin:    General: Skin is warm and dry.  Neurological:     Mental Status: She is alert and oriented to person, place, and time.     No results found for this or any previous visit (from the past 24 hour(s)).  No results found.   ASSESSMENT and PLAN  1. Anxiety and depression Controlled. Continue current regime.   2. BMI 40.0-44.9, adult (HCC) Discussed LFM, calorie counting, portion control, increasing exercise.   3. Vitamin D deficiency Start OTC supplement  4. Elevated BP without diagnosis of hypertension Discussed LFM, home BP monitoring, low salt. reassess at next OV, RTC precautions given  Return in about 3 months (around 11/06/2019).    Rutherford Guys, MD Primary Care at [place], [place] [address] Ph.  [phone] Fax [phone]

## 2019-08-16 ENCOUNTER — Encounter: Payer: Self-pay | Admitting: Family Medicine

## 2019-08-16 ENCOUNTER — Ambulatory Visit (INDEPENDENT_AMBULATORY_CARE_PROVIDER_SITE_OTHER): Payer: BC Managed Care – PPO | Admitting: Family Medicine

## 2019-08-16 ENCOUNTER — Other Ambulatory Visit: Payer: Self-pay

## 2019-08-16 ENCOUNTER — Ambulatory Visit (INDEPENDENT_AMBULATORY_CARE_PROVIDER_SITE_OTHER): Payer: BC Managed Care – PPO

## 2019-08-16 VITALS — BP 135/100 | HR 86 | Temp 98.0°F | Ht 68.0 in | Wt 263.6 lb

## 2019-08-16 DIAGNOSIS — S82821A Torus fracture of lower end of right fibula, initial encounter for closed fracture: Secondary | ICD-10-CM

## 2019-08-16 DIAGNOSIS — M25572 Pain in left ankle and joints of left foot: Secondary | ICD-10-CM

## 2019-08-16 DIAGNOSIS — Z23 Encounter for immunization: Secondary | ICD-10-CM

## 2019-08-16 NOTE — Patient Instructions (Addendum)
  You have a fracture of your left ankle.  The reading is: Nondisplaced oblique distal LEFT fibular metadiaphyseal fracture.  You can tell the orthopedic office that the films can be viewed on the [facility] health website.  You are to go to what used to be [facility] orthopedics above the [facility] [employer] [facility] to their walk-in office portion which is called [facility] and they are expecting you at about 130.  Return as needed   If you have lab work done today you will be contacted with your lab results within the next 2 weeks.  If you have not heard from [place] then please contact us. The fastest way to get your results is to register for My Chart.   IF you received an x-ray today, you will receive an invoice from [facility]. Please contact [facility] at [phone] with questions or concerns regarding your invoice.   IF you received labwork today, you will receive an invoice from [place]. Please contact [facility] at [phone] with questions or concerns regarding your invoice.   Our billing staff will not be able to assist you with questions regarding bills from these companies.  You will be contacted with the lab results as soon as they are available. The fastest way to get your results is to activate your My Chart account. Instructions are located on the last page of this paperwork. If you have not heard from [place] regarding the results in 2 weeks, please contact this office.

## 2019-08-16 NOTE — Progress Notes (Signed)
Patient ID: [name], female    DOB: 1980/05/18  Age: 39 y.o. MRN: [id number]  Chief Complaint  Patient presents with  . Foot Swelling    Pt stated that she fell outside round 6pm when she was out feeding her animals and stepped in a hole    Subjective:   Patient walked out in her yard yesterday and stepped in a hole with her right foot, falling onto her left ankle and twisting it as she fell.  She scraped below her left knee and hurt her ankle.  She is having a lot of pain and limping around today.  No history of bone fractures in the past.  Current allergies, medications, problem list, past/family and social histories reviewed.  Objective:  BP (!) 135/100 (BP Location: Right Arm, Patient Position: Sitting, Cuff Size: Large)   Pulse 86   Temp 98 F (36.7 C) (Temporal)   Ht 5\' 8"  (1.727 m)   Wt 263 lb 9.6 oz (119.6 kg)   LMP 07/28/2019   SpO2 95%   BMI 40.08 kg/m  Tender left ankle laterally and along the distal fibula.  She has a large abrasion below the left knee.  It is superficial scraping.  The x-ray report reveals: Nondisplaced oblique distal LEFT fibular metadiaphyseal fracture. Assessment & Plan:   Assessment: 1. Acute left ankle pain   2. Need for Tdap vaccination   3. Closed torus fracture of distal end of right fibula, initial encounter       Plan: I called and arrange for her to go to [facility] now..  We will put her in a long walking boot and give crutches.  They can manage from there.  Orders Placed This Encounter  Procedures  . DG Ankle Complete Left    Standing Status:   Future    Number of Occurrences:   1    Standing Expiration Date:   08/15/2020    Order Specific Question:   Reason for Exam (SYMPTOM  OR DIAGNOSIS REQUIRED)    Answer:   fell, lateral pain    Order Specific Question:   Is the patient pregnant?    Answer:   No    Order Specific Question:   Preferred imaging location?    Answer:   External  . Tdap vaccine greater than or equal  to 7yo IM    No orders of the defined types were placed in this encounter.        Patient Instructions    You have a fracture of your left ankle.  The reading is: Nondisplaced oblique distal LEFT fibular metadiaphyseal fracture.  You can tell the orthopedic office that the films can be viewed on the [facility] health website.  You are to go to what used to be [facility] orthopedics above the [facility] to their walk-in office portion which is called [facility] and they are expecting you at about 130.  Return as needed   If you have lab work done today you will be contacted with your lab results within the next 2 weeks.  If you have not heard from [employer] then please contact us. The fastest way to get your results is to register for My Chart.   IF you received an x-ray today, you will receive an invoice from [facility]. Please contact [facility] at [phone] with questions or concerns regarding your invoice.   IF you received labwork today, you will receive an invoice from [place]. Please contact [facility] at [phone] with  questions or concerns regarding your invoice.   Our billing staff will not be able to assist you with questions regarding bills from these companies.  You will be contacted with the lab results as soon as they are available. The fastest way to get your results is to activate your My Chart account. Instructions are located on the last page of this paperwork. If you have not heard from [place] regarding the results in 2 weeks, please contact this office.        Return if symptoms worsen or fail to improve.   Ruben Reason, MD 08/16/2019

## 2019-08-29 ENCOUNTER — Other Ambulatory Visit (HOSPITAL_COMMUNITY)
Admission: RE | Admit: 2019-08-29 | Discharge: 2019-08-29 | Disposition: A | Discharge status: Home / Self Care | Payer: BC Managed Care – PPO | Source: Ambulatory Visit | Attending: Orthopedic Surgery | Admitting: Orthopedic Surgery

## 2019-08-29 ENCOUNTER — Encounter (HOSPITAL_BASED_OUTPATIENT_CLINIC_OR_DEPARTMENT_OTHER): Payer: Self-pay | Admitting: Orthopedic Surgery

## 2019-08-29 ENCOUNTER — Other Ambulatory Visit (HOSPITAL_COMMUNITY): Payer: Self-pay | Admitting: Orthopedic Surgery

## 2019-08-29 ENCOUNTER — Other Ambulatory Visit: Payer: Self-pay

## 2019-08-29 DIAGNOSIS — Z20822 Contact with and (suspected) exposure to covid-19: Secondary | ICD-10-CM | POA: Insufficient documentation

## 2019-08-29 DIAGNOSIS — Z01812 Encounter for preprocedural laboratory examination: Secondary | ICD-10-CM | POA: Diagnosis not present

## 2019-08-29 LAB — SARS CORONAVIRUS 2 (TAT 6-24 HRS): SARS Coronavirus 2: NEGATIVE

## 2019-08-30 ENCOUNTER — Encounter (HOSPITAL_BASED_OUTPATIENT_CLINIC_OR_DEPARTMENT_OTHER): Payer: Self-pay | Admitting: Orthopedic Surgery

## 2019-08-30 ENCOUNTER — Ambulatory Visit (HOSPITAL_BASED_OUTPATIENT_CLINIC_OR_DEPARTMENT_OTHER): Payer: BC Managed Care – PPO | Admitting: Certified Registered"

## 2019-08-30 ENCOUNTER — Other Ambulatory Visit: Payer: Self-pay

## 2019-08-30 ENCOUNTER — Ambulatory Visit (HOSPITAL_BASED_OUTPATIENT_CLINIC_OR_DEPARTMENT_OTHER)
Admission: RE | Admit: 2019-08-30 | Discharge: 2019-08-30 | Disposition: A | Discharge status: Home / Self Care | Payer: BC Managed Care – PPO | Attending: Orthopedic Surgery | Admitting: Orthopedic Surgery

## 2019-08-30 ENCOUNTER — Encounter (HOSPITAL_BASED_OUTPATIENT_CLINIC_OR_DEPARTMENT_OTHER)
Admission: RE | Disposition: A | Discharge status: Home / Self Care | Payer: Self-pay | Source: Home / Self Care | Attending: Orthopedic Surgery

## 2019-08-30 DIAGNOSIS — Z79899 Other long term (current) drug therapy: Secondary | ICD-10-CM | POA: Diagnosis not present

## 2019-08-30 DIAGNOSIS — I10 Essential (primary) hypertension: Secondary | ICD-10-CM | POA: Insufficient documentation

## 2019-08-30 DIAGNOSIS — F329 Major depressive disorder, single episode, unspecified: Secondary | ICD-10-CM | POA: Insufficient documentation

## 2019-08-30 DIAGNOSIS — Z9884 Bariatric surgery status: Secondary | ICD-10-CM | POA: Diagnosis not present

## 2019-08-30 DIAGNOSIS — Y93K9 Activity, other involving animal care: Secondary | ICD-10-CM | POA: Insufficient documentation

## 2019-08-30 DIAGNOSIS — W19XXXA Unspecified fall, initial encounter: Secondary | ICD-10-CM | POA: Insufficient documentation

## 2019-08-30 DIAGNOSIS — G4730 Sleep apnea, unspecified: Secondary | ICD-10-CM | POA: Diagnosis not present

## 2019-08-30 DIAGNOSIS — Z7982 Long term (current) use of aspirin: Secondary | ICD-10-CM | POA: Diagnosis not present

## 2019-08-30 DIAGNOSIS — Z6841 Body Mass Index (BMI) 40.0 and over, adult: Secondary | ICD-10-CM | POA: Insufficient documentation

## 2019-08-30 DIAGNOSIS — S8265XA Nondisplaced fracture of lateral malleolus of left fibula, initial encounter for closed fracture: Secondary | ICD-10-CM | POA: Diagnosis present

## 2019-08-30 DIAGNOSIS — Z8249 Family history of ischemic heart disease and other diseases of the circulatory system: Secondary | ICD-10-CM | POA: Diagnosis not present

## 2019-08-30 DIAGNOSIS — F419 Anxiety disorder, unspecified: Secondary | ICD-10-CM | POA: Insufficient documentation

## 2019-08-30 HISTORY — PX: ORIF ANKLE FRACTURE: SHX5408

## 2019-08-30 LAB — POCT PREGNANCY, URINE: Preg Test, Ur: NEGATIVE

## 2019-08-30 SURGERY — OPEN REDUCTION INTERNAL FIXATION (ORIF) ANKLE FRACTURE
Anesthesia: General | Site: Ankle | Laterality: Left

## 2019-08-30 MED ORDER — DOCUSATE SODIUM 100 MG PO CAPS: 100.0000 mg | ORAL_CAPSULE | Freq: Every day | ORAL | 2 refills | Status: AC | PRN

## 2019-08-30 MED ORDER — 0.9 % SODIUM CHLORIDE (POUR BTL) OPTIME
TOPICAL | Status: DC | PRN
  Administered 2019-08-30: 1000 mL

## 2019-08-30 MED ORDER — VANCOMYCIN HCL 500 MG IV SOLR
INTRAVENOUS | Status: AC
  Filled 2019-08-30: qty 1000

## 2019-08-30 MED ORDER — LIDOCAINE HCL (CARDIAC) PF 100 MG/5ML IV SOSY
PREFILLED_SYRINGE | INTRAVENOUS | Status: DC | PRN
  Administered 2019-08-30: 30 mg via INTRAVENOUS

## 2019-08-30 MED ORDER — CEFAZOLIN SODIUM-DEXTROSE 2-4 GM/100ML-% IV SOLN
2.0000 g | INTRAVENOUS | Status: AC
  Administered 2019-08-30: 3 g via INTRAVENOUS

## 2019-08-30 MED ORDER — FENTANYL CITRATE (PF) 100 MCG/2ML IJ SOLN
INTRAMUSCULAR | Status: AC
  Filled 2019-08-30: qty 2

## 2019-08-30 MED ORDER — OXYCODONE HCL 5 MG/5ML PO SOLN: 5.0000 mg | Freq: Once | ORAL | Status: DC | PRN

## 2019-08-30 MED ORDER — PROPOFOL 10 MG/ML IV BOLUS
INTRAVENOUS | Status: DC | PRN
  Administered 2019-08-30: 200 mg via INTRAVENOUS

## 2019-08-30 MED ORDER — OXYCODONE HCL 5 MG PO TABS: 5.0000 mg | ORAL_TABLET | Freq: Once | ORAL | Status: DC | PRN

## 2019-08-30 MED ORDER — MIDAZOLAM HCL 2 MG/2ML IJ SOLN
2.0000 mg | Freq: Once | INTRAMUSCULAR | Status: AC
  Administered 2019-08-30: 2 mg via INTRAVENOUS

## 2019-08-30 MED ORDER — SENNA 8.6 MG PO TABS
2.0000 | ORAL_TABLET | Freq: Two times a day (BID) | ORAL | 0 refills | Status: DC
Start: 2019-08-30 — End: 2022-01-08

## 2019-08-30 MED ORDER — OXYCODONE HCL 5 MG PO TABS: 5.0000 mg | ORAL_TABLET | ORAL | 0 refills | Status: AC | PRN

## 2019-08-30 MED ORDER — CEFAZOLIN SODIUM-DEXTROSE 2-4 GM/100ML-% IV SOLN
INTRAVENOUS | Status: AC
  Filled 2019-08-30: qty 100

## 2019-08-30 MED ORDER — FENTANYL CITRATE (PF) 100 MCG/2ML IJ SOLN: 25.0000 ug | INTRAMUSCULAR | Status: DC | PRN

## 2019-08-30 MED ORDER — BUPIVACAINE HCL (PF) 0.5 % IJ SOLN
INTRAMUSCULAR | Status: AC
  Filled 2019-08-30: qty 30

## 2019-08-30 MED ORDER — PROMETHAZINE HCL 25 MG/ML IJ SOLN: 6.2500 mg | INTRAMUSCULAR | Status: DC | PRN

## 2019-08-30 MED ORDER — FENTANYL CITRATE (PF) 100 MCG/2ML IJ SOLN
50.0000 ug | Freq: Once | INTRAMUSCULAR | Status: AC
  Administered 2019-08-30: 50 ug via INTRAVENOUS

## 2019-08-30 MED ORDER — RIVAROXABAN 10 MG PO TABS
10.0000 mg | ORAL_TABLET | Freq: Every day | ORAL | 0 refills | Status: DC
Start: 2019-08-30 — End: 2022-01-08

## 2019-08-30 MED ORDER — LACTATED RINGERS IV SOLN: INTRAVENOUS | Status: DC

## 2019-08-30 MED ORDER — ONDANSETRON HCL 4 MG/2ML IJ SOLN
INTRAMUSCULAR | Status: DC | PRN
  Administered 2019-08-30: 4 mg via INTRAVENOUS

## 2019-08-30 MED ORDER — DEXAMETHASONE SODIUM PHOSPHATE 10 MG/ML IJ SOLN
INTRAMUSCULAR | Status: DC | PRN
  Administered 2019-08-30: 5 mg via INTRAVENOUS

## 2019-08-30 MED ORDER — BUPIVACAINE-EPINEPHRINE (PF) 0.5% -1:200000 IJ SOLN
INTRAMUSCULAR | Status: DC | PRN
  Administered 2019-08-30: 30 mL via PERINEURAL

## 2019-08-30 MED ORDER — MIDAZOLAM HCL 2 MG/2ML IJ SOLN
INTRAMUSCULAR | Status: AC
  Filled 2019-08-30: qty 2

## 2019-08-30 MED ORDER — PROPOFOL 500 MG/50ML IV EMUL
INTRAVENOUS | Status: DC | PRN
Start: 2019-08-30 — End: 2019-08-30
  Administered 2019-08-30: 25 ug/kg/min via INTRAVENOUS

## 2019-08-30 MED ORDER — CEFAZOLIN SODIUM-DEXTROSE 1-4 GM/50ML-% IV SOLN
INTRAVENOUS | Status: AC
  Filled 2019-08-30: qty 50

## 2019-08-30 MED ORDER — SODIUM CHLORIDE 0.9 % IV SOLN: INTRAVENOUS | Status: DC

## 2019-08-30 MED ORDER — VANCOMYCIN HCL 500 MG IV SOLR
INTRAVENOUS | Status: DC | PRN
  Administered 2019-08-30: 500 mg

## 2019-08-30 SURGICAL SUPPLY — 76 items
APL PRP STRL LF DISP 70% ISPRP (MISCELLANEOUS) ×1
BANDAGE ESMARK 6X9 LF (GAUZE/BANDAGES/DRESSINGS) IMPLANT
BIT DRILL 2.5X2.75 QC CALB (BIT) ×2 IMPLANT
BIT DRILL 3.5X5.5 QC CALB (BIT) ×2 IMPLANT
BLADE SURG 15 STRL LF DISP TIS (BLADE) ×2 IMPLANT
BLADE SURG 15 STRL SS (BLADE) ×6
BNDG CMPR 9X4 STRL LF SNTH (GAUZE/BANDAGES/DRESSINGS)
BNDG CMPR 9X6 STRL LF SNTH (GAUZE/BANDAGES/DRESSINGS)
BNDG COHESIVE 4X5 TAN STRL (GAUZE/BANDAGES/DRESSINGS) ×3 IMPLANT
BNDG COHESIVE 6X5 TAN STRL LF (GAUZE/BANDAGES/DRESSINGS) ×3 IMPLANT
BNDG ELASTIC 4X5.8 VLCR STR LF (GAUZE/BANDAGES/DRESSINGS) ×2 IMPLANT
BNDG ELASTIC 6X5.8 VLCR STR LF (GAUZE/BANDAGES/DRESSINGS) ×2 IMPLANT
BNDG ESMARK 4X9 LF (GAUZE/BANDAGES/DRESSINGS) IMPLANT
BNDG ESMARK 6X9 LF (GAUZE/BANDAGES/DRESSINGS)
CANISTER SUCT 1200ML W/VALVE (MISCELLANEOUS) ×3 IMPLANT
CHLORAPREP W/TINT 26 (MISCELLANEOUS) ×3 IMPLANT
COVER BACK TABLE 60X90IN (DRAPES) ×3 IMPLANT
COVER WAND RF STERILE (DRAPES) IMPLANT
CUFF TOURN SGL QUICK 34 (TOURNIQUET CUFF)
CUFF TRNQT CYL 34X4.125X (TOURNIQUET CUFF) IMPLANT
DECANTER SPIKE VIAL GLASS SM (MISCELLANEOUS) IMPLANT
DRAPE EXTREMITY T 121X128X90 (DISPOSABLE) ×3 IMPLANT
DRAPE OEC MINIVIEW 54X84 (DRAPES) ×3 IMPLANT
DRAPE U-SHAPE 47X51 STRL (DRAPES) ×3 IMPLANT
DRSG MEPITEL 4X7.2 (GAUZE/BANDAGES/DRESSINGS) ×3 IMPLANT
DRSG PAD ABDOMINAL 8X10 ST (GAUZE/BANDAGES/DRESSINGS) ×4 IMPLANT
ELECT REM PT RETURN 9FT ADLT (ELECTROSURGICAL) ×3
ELECTRODE REM PT RTRN 9FT ADLT (ELECTROSURGICAL) ×1 IMPLANT
GAUZE SPONGE 4X4 12PLY STRL (GAUZE/BANDAGES/DRESSINGS) ×3 IMPLANT
GLOVE BIO SURGEON STRL SZ8 (GLOVE) ×3 IMPLANT
GLOVE BIOGEL PI IND STRL 8 (GLOVE) ×2 IMPLANT
GLOVE BIOGEL PI INDICATOR 8 (GLOVE) ×4
GLOVE ECLIPSE 8.0 STRL XLNG CF (GLOVE) ×3 IMPLANT
GOWN STRL REUS W/ TWL LRG LVL3 (GOWN DISPOSABLE) ×1 IMPLANT
GOWN STRL REUS W/ TWL XL LVL3 (GOWN DISPOSABLE) ×2 IMPLANT
GOWN STRL REUS W/TWL LRG LVL3 (GOWN DISPOSABLE) ×3
GOWN STRL REUS W/TWL XL LVL3 (GOWN DISPOSABLE) ×6
NEEDLE HYPO 22GX1.5 SAFETY (NEEDLE) IMPLANT
NS IRRIG 1000ML POUR BTL (IV SOLUTION) ×3 IMPLANT
PAD CAST 4YDX4 CTTN HI CHSV (CAST SUPPLIES) ×1 IMPLANT
PADDING CAST ABS 4INX4YD NS (CAST SUPPLIES)
PADDING CAST ABS COTTON 4X4 ST (CAST SUPPLIES) IMPLANT
PADDING CAST COTTON 4X4 STRL (CAST SUPPLIES) ×3
PADDING CAST COTTON 6X4 STRL (CAST SUPPLIES) ×3 IMPLANT
PENCIL SMOKE EVACUATOR (MISCELLANEOUS) ×3 IMPLANT
PLATE ACE 100DEG 7HOLE (Plate) ×2 IMPLANT
SANITIZER HAND PURELL 535ML FO (MISCELLANEOUS) ×3 IMPLANT
SCREW CORTICAL 3.5MM  16MM (Screw) ×6 IMPLANT
SCREW CORTICAL 3.5MM  20MM (Screw) ×3 IMPLANT
SCREW CORTICAL 3.5MM 14MM (Screw) ×4 IMPLANT
SCREW CORTICAL 3.5MM 16MM (Screw) IMPLANT
SCREW CORTICAL 3.5MM 18MM (Screw) ×4 IMPLANT
SCREW CORTICAL 3.5MM 20MM (Screw) IMPLANT
SET BASIN DAY SURGERY F.S. (CUSTOM PROCEDURE TRAY) ×3 IMPLANT
SHEET MEDIUM DRAPE 40X70 STRL (DRAPES) ×3 IMPLANT
SLEEVE SCD COMPRESS KNEE MED (MISCELLANEOUS) ×3 IMPLANT
SPLINT FAST PLASTER 5X30 (CAST SUPPLIES) ×2
SPLINT PLASTER CAST FAST 5X30 (CAST SUPPLIES) ×20 IMPLANT
SPONGE LAP 18X18 RF (DISPOSABLE) ×3 IMPLANT
STOCKINETTE 6  STRL (DRAPES) ×3
STOCKINETTE 6 STRL (DRAPES) ×1 IMPLANT
SUCTION FRAZIER HANDLE 10FR (MISCELLANEOUS) ×3
SUCTION TUBE FRAZIER 10FR DISP (MISCELLANEOUS) ×1 IMPLANT
SUT ETHILON 3 0 PS 1 (SUTURE) ×3 IMPLANT
SUT FIBERWIRE #2 38 T-5 BLUE (SUTURE)
SUT MNCRL AB 3-0 PS2 18 (SUTURE) ×2 IMPLANT
SUT VIC AB 0 SH 27 (SUTURE) IMPLANT
SUT VIC AB 2-0 SH 27 (SUTURE) ×3
SUT VIC AB 2-0 SH 27XBRD (SUTURE) ×1 IMPLANT
SUTURE FIBERWR #2 38 T-5 BLUE (SUTURE) IMPLANT
SYR BULB EAR ULCER 3OZ GRN STR (SYRINGE) ×3 IMPLANT
SYR CONTROL 10ML LL (SYRINGE) IMPLANT
TOWEL GREEN STERILE FF (TOWEL DISPOSABLE) ×6 IMPLANT
TUBE CONNECTING 20'X1/4 (TUBING) ×1
TUBE CONNECTING 20X1/4 (TUBING) ×2 IMPLANT
UNDERPAD 30X36 HEAVY ABSORB (UNDERPADS AND DIAPERS) ×3 IMPLANT

## 2019-08-30 NOTE — Transfer of Care (Signed)
Immediate Anesthesia Transfer of Care Note  Patient: [name]  Procedure(s) Performed: OPEN REDUCTION INTERNAL FIXATION (ORIF) Left ankle lateral malleolus fracture (Left Ankle)  Patient Location: PACU  Anesthesia Type:GA combined with regional for post-op pain  Level of Consciousness: drowsy and patient cooperative  Airway & Oxygen Therapy: Patient Spontanous Breathing and Patient connected to face mask oxygen  Post-op Assessment: Report given to RN and Post -op Vital signs reviewed and stable  Post vital signs: Reviewed and stable  Last Vitals:  Vitals Value Taken Time  BP 142/98 08/30/19 0825  Temp    Pulse 92 08/30/19 0825  Resp 14 08/30/19 0825  SpO2 100 % 08/30/19 0825  Vitals shown include unvalidated device data.  Last Pain:  Vitals:   08/30/19 0639  PainSc: 1       Patients Stated Pain Goal: 1 (08/30/19 6773)  Complications: No apparent anesthesia complications

## 2019-08-30 NOTE — Op Note (Signed)
08/30/2019  8:34 AM  PATIENT:  [name]  39 y.o. adult  PRE-OPERATIVE DIAGNOSIS:  left ankle lateral malleolus fracture  POST-OPERATIVE DIAGNOSIS:  left ankle lateral malleolus fracture  Procedure(s): 1.  Open treatment of left ankle lateral malleolus fracture with internal fixation 2.  Stress examination of the left ankle under fluoroscopy 3.  AP, mortise and lateral radiographs of the left ankle  SURGEON:  Wylene Simmer, MD  ASSISTANT: Mechele Claude, PA-C  ANESTHESIA:   General, regional  EBL:  minimal   TOURNIQUET: 30 minutes at 200 mmHg calf tourniquet  COMPLICATIONS:  None apparent  DISPOSITION:  Extubated, awake and stable to recovery.  INDICATION FOR PROCEDURE: The patient is a 39 year old female without significant past medical history.  She fell while feeding her chickens approximately 2 weeks ago.  Initially she had a nondisplaced fracture and was treated in a cam boot.  On follow-up yesterday in the office she was noted to have interval displacement of the fracture with widening of the ankle mortise and shortening of the fibula.  She presents now for operative treatment of this displaced and unstable left ankle fracture.  The risks and benefits of the alternative treatment options have been discussed in detail.  The patient wishes to proceed with surgery and specifically understands risks of bleeding, infection, nerve damage, blood clots, need for additional surgery, amputation and death.  PROCEDURE IN DETAIL:  After pre operative consent was obtained, and the correct operative site was identified, the patient was brought to the operating room and placed supine on the OR table.  Anesthesia was administered.  Pre-operative antibiotics were administered.  A surgical timeout was taken.  The left lower extremity was prepped and draped in standard sterile fashion with a tourniquet around the calf.  The extremity was elevated and the calf tourniquet was inflated to 200 mmHg.  A  longitudinal incision was made over the lateral malleolus.  Dissection was carried down through the subcutaneous tissues.  The fracture site was identified.  Was cleaned of all hematoma and irrigated.  The fracture was reduced and held with a lobster claw.  A 3.5 mm fully threaded lag screw was inserted from posterior to anterior and compressed the fracture site appropriately.  A 7 hole one third tubular plate from the [employer] small frag set was contoured to fit the lateral malleolus.  It was secured proximally with 3 bicortical screws and distally with 3 unicortical screws.  AP, mortise and lateral radiographs confirmed appropriate reduction of the fracture in appropriate position and length of all hardware.  Stress examination was then performed.  Dorsiflexion and external rotation stress was applied to the supinated forefoot.  The mortise view was obtained with live fluoroscopy.  There is no widening evident at the medial clear space or distal tib-fib overlap.  The wound was irrigated and sprinkled with vancomycin powder.  Subcutaneous tissues were approximated with Vicryl.  The skin incision was closed with 3-0 nylon.  Sterile dressings were applied followed by a well-padded short leg splint.  The tourniquet was released after application of the dressings.  The patient was awakened from anesthesia and transported to the recovery room in stable condition.   FOLLOW UP PLAN: Nonweightbearing on the left lower extremity for the next 2 weeks.  Follow-up in the office for suture removal and conversion to a cam boot to initiate early range of motion and weightbearing.  Xarelto for DVT prophylaxis since the patient takes oral contraceptive pills.   RADIOGRAPHS: AP, mortise  and lateral radiographs of the left ankle show interval reduction fixation of the lateral malleolus fracture.  Hardware is appropriately positioned and of the appropriate lengths.  No other acute injuries are noted.    Alfredo Martinez  PA-C was present and scrubbed for the duration of the operative case. His assistance was essential in positioning the patient, prepping and draping, gaining and maintaining exposure, performing the operation, closing and dressing the wounds and applying the splint.

## 2019-08-30 NOTE — Progress Notes (Signed)
Assisted Dr. Brock with left, ultrasound guided, popliteal block. Side rails up, monitors on throughout procedure. See vital signs in flow sheet. Tolerated Procedure well. 

## 2019-08-30 NOTE — Anesthesia Postprocedure Evaluation (Signed)
Anesthesia Post Note  Patient: [name]  Procedure(s) Performed: OPEN REDUCTION INTERNAL FIXATION (ORIF) Left ankle lateral malleolus fracture (Left Ankle)     Patient location during evaluation: PACU Anesthesia Type: General Level of consciousness: awake and alert Pain management: pain level controlled Vital Signs Assessment: post-procedure vital signs reviewed and stable Respiratory status: spontaneous breathing, nonlabored ventilation and respiratory function stable Cardiovascular status: blood pressure returned to baseline and stable Postop Assessment: no apparent nausea or vomiting Anesthetic complications: no    Last Vitals:  Vitals:   08/30/19 0845 08/30/19 0900  BP: (!) 146/95 (!) 147/96  Pulse: 80 81  Resp: 13 18  Temp:    SpO2: 100% 100%    Last Pain:  Vitals:   08/30/19 0920  PainSc: 0-No pain    LLE Motor Response: Purposeful movement (08/30/19 0920) LLE Sensation: Decreased(regional block) (08/30/19 0920)          Beryle Lathe

## 2019-08-30 NOTE — Anesthesia Procedure Notes (Signed)
Procedure Name: LMA Insertion Date/Time: 08/30/2019 7:32 AM Performed by: Sheryn Bison, CRNA Pre-anesthesia Checklist: Patient identified, Emergency Drugs available, Suction available and Patient being monitored Patient Re-evaluated:Patient Re-evaluated prior to induction Oxygen Delivery Method: Circle system utilized Preoxygenation: Pre-oxygenation with 100% oxygen Induction Type: IV induction Ventilation: Mask ventilation without difficulty LMA: LMA inserted LMA Size: 4.0 Number of attempts: 1 Airway Equipment and Method: Bite block Placement Confirmation: positive ETCO2 Tube secured with: Tape Dental Injury: Teeth and Oropharynx as per pre-operative assessment

## 2019-08-30 NOTE — H&P (Signed)
[name] is an 39 y.o. adult.   Chief Complaint: Left ankle pain HPI: The patient is a 39 year old woman without significant past medical history.  She injured her left ankle 2 weeks ago feeding her chickens.  She initially had a nondisplaced lateral malleolus fracture.  She was treated in a cam boot.  Follow-up yesterday in the office revealed displacement of the fracture with shortening of the fibula and widening of the ankle mortise.  She presents now for operative treatment of this displaced and unstable lateral malleolus fracture.  Past Medical History:  Diagnosis Date  . Anxiety    xanax prn  . Complication of anesthesia    feels panic with face mask  . Depression   . Headache(784.0)    hx migraines  . Hiatal hernia   . Hypertension    previously on BP med - none currently  . Recurrent dry cough   . Sleep apnea    unable to tolerate mask     Past Surgical History:  Procedure Laterality Date  . APPENDECTOMY    . BREATH TEK H PYLORI N/A 07/20/2013   Procedure: BREATH TEK H PYLORI;  Surgeon: Shann Medal, MD;  Location: [facility];  Service: General;  Laterality: N/A;  . CHOLECYSTECTOMY    . LAPAROSCOPIC GASTRIC SLEEVE RESECTION N/A 09/18/2013   Procedure: LAPAROSCOPIC GASTRIC SLEEVE RESECTION UPPER ENDOSCOPY;  Surgeon: Shann Medal, MD;  Location: [facility];  Service: General;  Laterality: N/A;    Family History  Problem Relation Age of Onset  . Depression Mother   . Kidney disease Mother   . Liver disease Mother   . Diverticulitis Mother   . Heart failure Father   . Diabetes Father   . Psychiatric Illness Sister    Social History:  reports that he has never smoked. He has never used smokeless tobacco. He reports current alcohol use. He reports that he does not use drugs.  Allergies: No Known Allergies  Medications Prior to Admission  Medication Sig Dispense Refill  . acetaminophen (TYLENOL) 325 MG tablet Take 650 mg by mouth every 6 (six) hours as needed.    .  ALPRAZolam (XANAX) 0.5 MG tablet Take 0.5 mg by mouth 2 (two) times daily as needed.    Marland Kitchen aspirin EC 81 MG tablet Take 81 mg by mouth daily.    . busPIRone (BUSPAR) 5 MG tablet TAKE 1 TABLET BY MOUTH THREE TIMES A DAY 270 tablet 0  . cholecalciferol (VITAMIN D3) 25 MCG (1000 UNIT) tablet Take 1,000 Units by mouth daily.    Marland Kitchen desvenlafaxine (PRISTIQ) 100 MG 24 hr tablet Take 100 mg by mouth daily.    . fluticasone (FLONASE) 50 MCG/ACT nasal spray Place 1 spray into both nostrils 2 (two) times daily. 16 g 6  . ibuprofen (ADVIL) 400 MG tablet Take 400 mg by mouth every 6 (six) hours as needed.    . SPRINTEC 28 0.25-35 MG-MCG tablet Take 1 tablet by mouth daily.    . traMADol (ULTRAM) 50 MG tablet Take by mouth every 6 (six) hours as needed.      Results for orders placed or performed during the hospital encounter of 08/30/19 (from the past 48 hour(s))  Pregnancy, urine POC     Status: None   Collection Time: 08/30/19  6:31 AM  Result Value Ref Range   Preg Test, Ur NEGATIVE NEGATIVE    Comment:        THE SENSITIVITY OF THIS METHODOLOGY IS >24 mIU/mL  No results found.  Review of Systems no recent fever, chills, nausea, vomiting or changes in her appetite  Blood pressure (!) 150/97, pulse 86, temperature 98.9 F (37.2 C), resp. rate 19, height 5\' 7"  (1.702 m), weight 120.7 kg, last menstrual period 2019/09/21, SpO2 99 %. Physical Exam  Well-nourished well-developed woman in no apparent distress.  Alert and oriented.  Normal mood and affect.  Gait is nonweightbearing on the left.  Left ankle has healthy skin.  Pulses are palpable in the foot.  No lymphadenopathy.  Tender to palpation of the lateral malleolus. Assessment/Plan Left ankle displaced lateral malleolus fracture -to the operating room today for open treatment with internal fixation.  The risks and benefits of the alternative treatment options have been discussed in detail.  The patient wishes to proceed with surgery and  specifically understands risks of bleeding, infection, nerve damage, blood clots, need for additional surgery, amputation and death.   10/30/2019, MD 21-Sep-2019, 7:20 AM

## 2019-08-30 NOTE — Discharge Instructions (Signed)
Wylene Simmer, MD [facility]  Please read the following information regarding your care after surgery.  Medications  You only need a prescription for the narcotic pain medicine (ex. oxycodone, Percocet, Norco).  All of the other medicines listed below are available over the counter. X Ibuprofen that you have at home as prescribed for the first 3 days after surgery. X acetominophen (Tylenol) 650 mg every 4-6 hours as you need for minor to moderate pain X oxycodone as prescribed for severe pain  Narcotic pain medicine (ex. oxycodone, Percocet, Vicodin) will cause constipation.  To prevent this problem, take the following medicines while you are taking any pain medicine. X docusate sodium (Colace) 100 mg twice a day X senna (Senokot) 2 tablets twice a day  X To help prevent blood clots, take Xarelto as prescribed for 12 days after surgery.  You should also get up every hour while you are awake to move around.    Weight Bearing X Do not bear any weight on the operated leg or foot.  Cast / Splint / Dressing X Keep your splint, cast or dressing clean and dry.  Don't put anything (coat hanger, pencil, etc) down inside of it.  If it gets damp, use a hair dryer on the cool setting to dry it.  If it gets soaked, call the office to schedule an appointment for a cast change.  After your dressing, cast or splint is removed; you may shower, but do not soak or scrub the wound.  Allow the water to run over it, and then gently pat it dry.  Swelling It is normal for you to have swelling where you had surgery.  To reduce swelling and pain, keep your toes above your nose for at least 3 days after surgery.  It may be necessary to keep your foot or leg elevated for several weeks.  If it hurts, it should be elevated.  Follow Up Call my office at [phone] when you are discharged from the hospital or surgery center to schedule an appointment to be seen two weeks after surgery.  Call my office at [phone]  if you develop a fever >101.5 F, nausea, vomiting, bleeding from the surgical site or severe pain.     Post Anesthesia Home Care Instructions  Activity: Get plenty of rest for the remainder of the day. A responsible individual must stay with you for 24 hours following the procedure.  For the next 24 hours, DO NOT: -Drive a car -Paediatric nurse -Drink alcoholic beverages -Take any medication unless instructed by your physician -Make any legal decisions or sign important papers.  Meals: Start with liquid foods such as gelatin or soup. Progress to regular foods as tolerated. Avoid greasy, spicy, heavy foods. If nausea and/or vomiting occur, drink only clear liquids until the nausea and/or vomiting subsides. Call your physician if vomiting continues.  Special Instructions/Symptoms: Your throat may feel dry or sore from the anesthesia or the breathing tube placed in your throat during surgery. If this causes discomfort, gargle with warm salt water. The discomfort should disappear within 24 hours.      Regional Anesthesia Blocks  1. Numbness or the inability to move the "blocked" extremity may last from 3-48 hours after placement. The length of time depends on the medication injected and your individual response to the medication. If the numbness is not going away after 48 hours, call your surgeon.  2. The extremity that is blocked will need to be protected until the numbness is gone and  the  Strength has returned. Because you cannot feel it, you will need to take extra care to avoid injury. Because it may be weak, you may have difficulty moving it or using it. You may not know what position it is in without looking at it while the block is in effect.  3. For blocks in the legs and feet, returning to weight bearing and walking needs to be done carefully. You will need to wait until the numbness is entirely gone and the strength has returned. You should be able to move your leg and foot  normally before you try and bear weight or walk. You will need someone to be with you when you first try to ensure you do not fall and possibly risk injury.  4. Bruising and tenderness at the needle site are common side effects and will resolve in a few days.  5. Persistent numbness or new problems with movement should be communicated to the surgeon or the [facility] [phone] [facility] [phone]).

## 2019-08-30 NOTE — Anesthesia Procedure Notes (Signed)
Anesthesia Regional Block: Popliteal block   Pre-Anesthetic Checklist: ,, timeout performed, Correct Patient, Correct Site, Correct Laterality, Correct Procedure, Correct Position, site marked, Risks and benefits discussed,  Surgical consent,  Pre-op evaluation,  At surgeon's request and post-op pain management  Laterality: Left  Prep: chloraprep       Needles:  Injection technique: Single-shot  Needle Type: Echogenic Needle     Needle Length: 10cm  Needle Gauge: 21     Additional Needles:   Narrative:  Start time: 08/30/2019 7:04 AM End time: 08/30/2019 7:08 AM Injection made incrementally with aspirations every 5 mL.  Performed by: Personally  Anesthesiologist: Beryle Lathe, MD  Additional Notes: No pain on injection. No increased resistance to injection. Injection made in 5cc increments. Good needle visualization. Patient tolerated the procedure well.

## 2019-08-30 NOTE — Anesthesia Preprocedure Evaluation (Addendum)
Anesthesia Evaluation  Patient identified by MRN, date of birth, ID band Patient awake    Reviewed: Allergy & Precautions, NPO status , Patient's Chart, lab work & pertinent test results  History of Anesthesia Complications Negative for: history of anesthetic complications  Airway Mallampati: II  TM Distance: >3 FB Neck ROM: Full    Dental  (+) Dental Advisory Given, Teeth Intact   Pulmonary sleep apnea (noncompliant) ,    Pulmonary exam normal        Cardiovascular hypertension (no meds), Normal cardiovascular exam     Neuro/Psych  Headaches, PSYCHIATRIC DISORDERS Anxiety Depression    GI/Hepatic Neg liver ROS, hiatal hernia,  S/p sleeve gastrectomy    Endo/Other  Morbid obesity  Renal/GU negative Renal ROS     Musculoskeletal negative musculoskeletal ROS (+)   Abdominal   Peds  Hematology negative hematology ROS (+)   Anesthesia Other Findings Covid neg 6/2  Reproductive/Obstetrics                           Anesthesia Physical Anesthesia Plan  ASA: III  Anesthesia Plan: General   Post-op Pain Management:  Regional for Post-op pain   Induction: Intravenous  PONV Risk Score and Plan: 3 and Treatment may vary due to age or medical condition, Ondansetron, Dexamethasone and Midazolam  Airway Management Planned: LMA  Additional Equipment: None  Intra-op Plan:   Post-operative Plan: Extubation in OR  Informed Consent: I have reviewed the patients History and Physical, chart, labs and discussed the procedure including the risks, benefits and alternatives for the proposed anesthesia with the patient or authorized representative who has indicated his/her understanding and acceptance.     Dental advisory given  Plan Discussed with: CRNA and Anesthesiologist  Anesthesia Plan Comments:        Anesthesia Quick Evaluation

## 2019-08-31 ENCOUNTER — Other Ambulatory Visit: Payer: Self-pay | Admitting: Family Medicine

## 2019-08-31 ENCOUNTER — Encounter: Payer: Self-pay | Admitting: Family Medicine

## 2019-08-31 ENCOUNTER — Other Ambulatory Visit: Payer: Self-pay

## 2019-08-31 ENCOUNTER — Encounter: Payer: Self-pay | Admitting: *Deleted

## 2019-08-31 MED ORDER — LISINOPRIL-HYDROCHLOROTHIAZIDE 10-12.5 MG PO TABS: 1.0000 | ORAL_TABLET | Freq: Every day | ORAL | 3 refills | Status: DC

## 2019-08-31 MED ORDER — ALPRAZOLAM 0.5 MG PO TABS: 0.5000 mg | ORAL_TABLET | Freq: Two times a day (BID) | ORAL | 0 refills | Status: AC | PRN

## 2019-08-31 NOTE — Telephone Encounter (Signed)
Patient is requesting a refill of the following medications: Requested Prescriptions   Pending Prescriptions Disp Refills  . ALPRAZolam (XANAX) 0.5 MG tablet      Sig: Take 1 tablet (0.5 mg total) by mouth 2 (two) times daily as needed.    Date of patient request: 08/31/2019 Last office visit: 08/16/2019 [name] 08/06/2019 [name] Date of last refill: 03/05/2019 Last refill amount: unknown Follow up time period per chart: 3 months per visit 08/06/2019

## 2019-08-31 NOTE — Telephone Encounter (Signed)
Pt had surgery 08/30/2019, pt reports her BP was high at that time including while she was under anastatic. She has not been able to keep the log of pressures you had requested, but reports it has been high each time she visits ortho. Pt also complains of feeling flushed, having headaches states she has been on BP meds before. She is worried she needs them again. Would you like her to return for evaluation?

## 2019-09-10 DIAGNOSIS — S8263XA Displaced fracture of lateral malleolus of unspecified fibula, initial encounter for closed fracture: Secondary | ICD-10-CM

## 2019-09-10 HISTORY — DX: Displaced fracture of lateral malleolus of unspecified fibula, initial encounter for closed fracture: S82.63XA

## 2019-09-12 ENCOUNTER — Encounter: Payer: Self-pay | Admitting: Family Medicine

## 2019-09-12 NOTE — Telephone Encounter (Signed)
Pt started Lisinopril HCTZ 08/31/2019 and is now reporting some stomach pain, and tightness, hungry but can only eat a few bites, is this something that could be coming from the medication or do you think this is unrelated?

## 2019-10-08 ENCOUNTER — Other Ambulatory Visit: Payer: Self-pay | Admitting: Family Medicine

## 2019-10-08 ENCOUNTER — Encounter: Payer: Self-pay | Admitting: Family Medicine

## 2019-10-08 MED ORDER — DESVENLAFAXINE SUCCINATE ER 100 MG PO TB24: 100.0000 mg | ORAL_TABLET | Freq: Every day | ORAL | 1 refills | Status: DC

## 2019-10-08 NOTE — Telephone Encounter (Signed)
Requested medication (s) are due for refill today: no  Requested medication (s) are on the active medication list: yes  Last refill:  07/09/2019  Future visit scheduled: yes  Notes to clinic: medication last filled by historical provider  Review for refill   Requested Prescriptions  Pending Prescriptions Disp Refills   desvenlafaxine (PRISTIQ) 100 MG 24 hr tablet [Pharmacy Med Name: DESVENLAFAXINE SUCCNT ER 100MG ] 90 tablet 2    Sig: TAKE 1 TABLET BY MOUTH EVERY DAY      Psychiatry: Antidepressants - SNRI - desvenlafaxine & venlafaxine Failed - 10/08/2019  3:04 PM      Failed - LDL in normal range and within 360 days    LDL Chol Calc (NIH)  Date Value Ref Range Status  06/21/2019 176 (H) 0 - 99 mg/dL Final          Failed - Total Cholesterol in normal range and within 360 days    Cholesterol, Total  Date Value Ref Range Status  06/21/2019 283 (H) 100 - 199 mg/dL Final          Failed - Triglycerides in normal range and within 360 days    Triglycerides  Date Value Ref Range Status  06/21/2019 209 (H) 0 - 149 mg/dL Final          Failed - Last BP in normal range    BP Readings from Last 1 Encounters:  08/30/19 (!) 153/98          Passed - Completed PHQ-2 or PHQ-9 in the last 360 days.      Passed - Valid encounter within last 6 months    Recent Outpatient Visits           1 month ago Acute left ankle pain   [facility] [place], [facility], MD   2 months ago Anxiety and depression   [facility], [facility], MD   3 months ago Anxiety and depression   [facility], [facility], MD       Future Appointments             In 1 month [facility], MD [facility] [place], [facility]

## 2019-11-12 ENCOUNTER — Ambulatory Visit: Payer: BC Managed Care – PPO | Admitting: Family Medicine

## 2019-11-22 ENCOUNTER — Other Ambulatory Visit: Payer: Self-pay | Admitting: Family Medicine

## 2019-11-22 NOTE — Telephone Encounter (Signed)
Requested Prescriptions  Pending Prescriptions Disp Refills   lisinopril-hydrochlorothiazide (ZESTORETIC) 10-12.5 MG tablet [Pharmacy Med Name: LISINOPRIL-HCTZ 10-12.5 MG TAB] 90 tablet 0    Sig: TAKE 1 TABLET BY MOUTH EVERY DAY     Cardiovascular:  ACEI + Diuretic Combos Failed - 11/22/2019  1:08 AM      Failed - Last BP in normal range    BP Readings from Last 1 Encounters:  08/30/19 (!) 153/98         Passed - Na in normal range and within 180 days    Sodium  Date Value Ref Range Status  06/21/2019 138 134 - 144 mmol/L Final         Passed - K in normal range and within 180 days    Potassium  Date Value Ref Range Status  06/21/2019 4.5 3.5 - 5.2 mmol/L Final         Passed - Cr in normal range and within 180 days    Creat  Date Value Ref Range Status  03/20/2014 0.64 0.50 - 1.10 mg/dL Final   Creatinine, Ser  Date Value Ref Range Status  06/21/2019 0.75 0.57 - 1.00 mg/dL Final         Passed - Ca in normal range and within 180 days    Calcium  Date Value Ref Range Status  06/21/2019 9.5 8.7 - 10.2 mg/dL Final         Passed - Patient is not pregnant      Passed - Valid encounter within last 6 months    Recent Outpatient Visits          3 months ago Acute left ankle pain   [facility] at [facility], [facility], MD   3 months ago Anxiety and depression   [facility], [facility], MD   5 months ago Anxiety and depression   [facility], [facility], MD              busPIRone (BUSPAR) 5 MG tablet [Pharmacy Med Name: BUSPIRONE HCL 5 MG TABLET] 270 tablet 0    Sig: TAKE 1 TABLET BY MOUTH THREE TIMES A DAY     Psychiatry: Anxiolytics/Hypnotics - Non-controlled Passed - 11/22/2019  1:08 AM      Passed - Valid encounter within last 6 months    Recent Outpatient Visits          3 months ago Acute left ankle pain   [facility] at [facility], [facility], MD   3 months ago Anxiety and depression   [facility], [facility], MD   5 months ago Anxiety and depression   [facility], [facility], MD             Reminder for patient to schedule an office visit for 6 month follow-up.

## 2021-04-14 ENCOUNTER — Other Ambulatory Visit: Payer: Self-pay | Admitting: Obstetrics and Gynecology

## 2021-04-14 DIAGNOSIS — R928 Other abnormal and inconclusive findings on diagnostic imaging of breast: Secondary | ICD-10-CM

## 2021-04-14 LAB — HM PAP SMEAR: HM Pap smear: NORMAL

## 2021-05-05 ENCOUNTER — Other Ambulatory Visit: Payer: Self-pay

## 2021-05-05 ENCOUNTER — Encounter: Payer: Self-pay | Admitting: Emergency Medicine

## 2021-05-05 ENCOUNTER — Ambulatory Visit
Admission: EM | Admit: 2021-05-05 | Discharge: 2021-05-05 | Disposition: A | Discharge status: Other Acute Inpatient Hospital | Payer: BC Managed Care – PPO | Attending: Internal Medicine | Admitting: Internal Medicine

## 2021-05-05 ENCOUNTER — Emergency Department
Admission: EM | Admit: 2021-05-05 | Discharge: 2021-05-05 | Disposition: A | Discharge status: Home / Self Care | Payer: BC Managed Care – PPO | Attending: Emergency Medicine | Admitting: Emergency Medicine

## 2021-05-05 ENCOUNTER — Emergency Department: Payer: BC Managed Care – PPO

## 2021-05-05 DIAGNOSIS — R197 Diarrhea, unspecified: Secondary | ICD-10-CM | POA: Diagnosis not present

## 2021-05-05 DIAGNOSIS — M79602 Pain in left arm: Secondary | ICD-10-CM

## 2021-05-05 DIAGNOSIS — R6884 Jaw pain: Secondary | ICD-10-CM

## 2021-05-05 DIAGNOSIS — Z20822 Contact with and (suspected) exposure to covid-19: Secondary | ICD-10-CM | POA: Insufficient documentation

## 2021-05-05 DIAGNOSIS — R079 Chest pain, unspecified: Secondary | ICD-10-CM | POA: Diagnosis present

## 2021-05-05 DIAGNOSIS — R0789 Other chest pain: Secondary | ICD-10-CM | POA: Insufficient documentation

## 2021-05-05 DIAGNOSIS — M542 Cervicalgia: Secondary | ICD-10-CM

## 2021-05-05 LAB — BASIC METABOLIC PANEL
Anion gap: 9 (ref 5–15)
BUN: 10 mg/dL (ref 6–20)
CO2: 25 mmol/L (ref 22–32)
Calcium: 9.1 mg/dL (ref 8.9–10.3)
Chloride: 105 mmol/L (ref 98–111)
Creatinine, Ser: 0.79 mg/dL (ref 0.44–1.00)
GFR, Estimated: >60 mL/min (ref >60)
Glucose, Bld: 98 mg/dL (ref 70–99)
Potassium: 4.2 mmol/L (ref 3.5–5.1)
Sodium: 139 mmol/L (ref 135–145)

## 2021-05-05 LAB — CBC
HCT: 40 % (ref 36.0–46.0)
Hemoglobin: 13.1 g/dL (ref 12.0–15.0)
MCH: 29.2 pg (ref 26.0–34.0)
MCHC: 32.8 g/dL (ref 30.0–36.0)
MCV: 89.3 fL (ref 80.0–100.0)
Platelets: 346 10*3/uL (ref 150–400)
RBC: 4.48 MIL/uL (ref 3.87–5.11)
RDW: 11.9 % (ref 11.5–15.5)
WBC: 8.5 10*3/uL (ref 4.0–10.5)
nRBC: 0 % (ref 0.0–0.2)

## 2021-05-05 LAB — RESP PANEL BY RT-PCR (FLU A&B, COVID) ARPGX2
Influenza A by PCR: NEGATIVE
Influenza B by PCR: NEGATIVE
SARS Coronavirus 2 by RT PCR: NEGATIVE

## 2021-05-05 LAB — TROPONIN I (HIGH SENSITIVITY): Troponin I (High Sensitivity): 3 ng/L (ref <18)

## 2021-05-05 LAB — D-DIMER, QUANTITATIVE: D-Dimer, Quant: 0.41 ug/mL-FEU (ref 0.00–0.50)

## 2021-05-05 NOTE — ED Provider Notes (Signed)
[facility] Provider Note    Event Date/Time   First MD Initiated Contact with Patient 05/05/21 1407     (approximate)   History   Chest Pain   HPI  [name] is a 41 y.o. adult with past medical history of anxiety and high cholesterol who presents for assessment of 3 to 4 days of some left-sided chest pain rating to left arm.  She states she is some diarrhea yesterday but none today.  She states she felt like she had to cough a little bit but otherwise has not had any shortness of breath or abdominal pain or any back pain or right upper extremity pain.  No fevers, headache, earache, sore throat, congestion or any other clear associated sick symptoms.  No known cardiac or pulmonary history.  Patient denies tobacco abuse or EtOH abuse.  She was initially seen at urgent care clinic and referred to the emergency room for further evaluation.  I was able to review this note.      Physical Exam  Triage Vital Signs: ED Triage Vitals [05/05/21 1338]  Enc Vitals Group     BP (!) 154/104     Pulse Rate 83     Resp 18     Temp 98.3 F (36.8 C)     Temp Source Oral     SpO2 99 %     Weight 260 lb (117.9 kg)     Height 5\' 8"  (1.727 m)     Head Circumference      Peak Flow      Pain Score 3     Pain Loc      Pain Edu?      Excl. in GC?     Most recent vital signs: Vitals:   05/05/21 1338 05/05/21 1500  BP: (!) 154/104   Pulse: 83 67  Resp: 18 20  Temp: 98.3 F (36.8 C)   SpO2: 99% 98%    General: Awake, no distress.  CV:  Good peripheral perfusion.  No murmurs rubs or gallops. Resp:  Normal effort.  Clear bilaterally Abd:  No distention.  Soft throughout. Other:  Upper extremities unremarkable with 2+ pulses and full range of motion and no overlying skin changes over the shoulder or upper arm.   ED Results / Procedures / Treatments  Labs (all labs ordered are listed, but only abnormal results are displayed) Labs Reviewed  RESP PANEL BY  RT-PCR (FLU A&B, COVID) ARPGX2  BASIC METABOLIC PANEL  CBC  D-DIMER, QUANTITATIVE  POC URINE PREG, ED  TROPONIN I (HIGH SENSITIVITY)     EKG  EKG shows sinus rhythm with a ventricular rate of 69 and some artifact in leads I and lead II with nonspecific change in lead III without any other clear evidence of acute ischemia or significant arrhythmia.   RADIOLOGY Chest reviewed by myself shows no focal consoidation, effusion, edema, pneumothorax or other clear acute thoracic process. I also reviewed radiology interpretation and agree with findings described.    PROCEDURES:  Critical Care performed: No  .1-3 Lead EKG Interpretation Performed by: 07/03/21, MD Authorized by: Gilles Chiquito, MD     Interpretation: normal     ECG rate assessment: normal     Rhythm: sinus rhythm     Ectopy: none     Conduction: normal    The patient is on the cardiac monitor to evaluate for evidence of arrhythmia and/or significant heart rate changes.   MEDICATIONS ORDERED  IN ED: Medications - No data to display   IMPRESSION / MDM / ASSESSMENT AND PLAN / ED COURSE  I reviewed the triage vital signs and the nursing notes.                              Differential diagnosis includes, but is not limited to ACS, PE, pneumonia, bronchitis, GERD and esophageal etiologies.  No evidence of trauma or injury or DVT in the left upper extremity.  Low suspicion for dissection given overall symptoms.  She has no abdominal tenderness and I have a low suspicion for pancreatitis or cholecystitis.  Certainly possible her diarrhea yesterday is from an acute viral syndrome.  EKG shows sinus rhythm with a ventricular rate of 69 and some artifact in leads I and lead II with nonspecific change in lead III without any other clear evidence of acute ischemia or significant arrhythmia.  Given nonelevated troponin obtained greater than 3 hours after symptom onset I have a very low suspicion for ACS or  myocarditis.  Chest reviewed by myself shows no focal consoidation, effusion, edema, pneumothorax or other clear acute thoracic process. I also reviewed radiology interpretation and agree with findings described.  CBC shows no leukocytosis or acute anemia.  BMP without any significant electrolyte or metabolic derangements.  Care patient signed over to assuming father approximately 1500.  Plan is to follow-up D-dimer and reassess.  I will also send a COVID and influenza PCR.      FINAL CLINICAL IMPRESSION(S) / ED DIAGNOSES   Final diagnoses:  Chest pain, unspecified type     Rx / DC Orders   ED Discharge Orders     None        Note:  This document was prepared using Dragon voice recognition software and may include unintentional dictation errors.   Gilles Chiquito, MD 05/05/21 [phone]

## 2021-05-05 NOTE — Discharge Instructions (Addendum)
Please go to the hospital as soon as you leave urgent care for further evaluation and management. 

## 2021-05-05 NOTE — ED Triage Notes (Signed)
Pt here for left arm pain into jaw with some chest tightness x 3 days

## 2021-05-05 NOTE — ED Notes (Signed)
RN attempting IV access without success. IV team consult placed

## 2021-05-05 NOTE — ED Provider Notes (Signed)
[name]    CSN: [id number] Arrival date & time: 05/05/21  1240      History   Chief Complaint Chief Complaint  Patient presents with   Arm Pain   Chest Pain    HPI [name] is a 41 y.o. adult.   Patient presents with left arm pain, left-sided jaw pain, chest pain and tightness that has been present for approximately 3 days.  Pain has been persistent and not intermittent.  Denies any associated shortness of breath.  She also reports that she has had a headache but denies dizziness, blurred vision, nausea, vomiting.  Denies any history of cardiac issues.  She does report that she has a history of anxiety.  Symptoms started the day after she went out drinking alcohol with her friends so she attributed this to a hangover but symptoms have been persistent.  Denies any relieving or aggravating factors to pain.   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   Morbid obesity ([place]) 06/21/2013    Past Surgical History:  Procedure Laterality Date   APPENDECTOMY     BREATH TEK H PYLORI N/A 07/20/2013   Procedure: BREATH TEK H PYLORI;  Surgeon: Shann Medal, MD;  Location: [facility];  Service: General;  Laterality: N/A;   CHOLECYSTECTOMY     LAPAROSCOPIC GASTRIC SLEEVE RESECTION N/A 09/18/2013   Procedure: LAPAROSCOPIC GASTRIC SLEEVE RESECTION UPPER ENDOSCOPY;  Surgeon: Shann Medal, MD;  Location: [facility];  Service: General;  Laterality: N/A;    ORIF ANKLE FRACTURE Left 08/30/2019   Procedure: OPEN REDUCTION INTERNAL FIXATION (ORIF) Left ankle lateral malleolus fracture;  Surgeon: Wylene Simmer, MD;  Location: [place];  Service: Orthopedics;  Laterality: Left;  59min    OB History   No obstetric history on file.      Home Medications    Prior to Admission medications   Medication Sig Start Date End Date Taking? Authorizing Provider  acetaminophen (TYLENOL) 325 MG tablet Take 650 mg by mouth every 6 (six) hours as needed.    [provider]  ALPRAZolam Duanne Moron) 0.5 MG tablet Take 1 tablet (0.5 mg total) by mouth 2 (two) times daily as needed. 08/31/19   Daleen Squibb, MD  aspirin EC 81 MG tablet Take 81 mg by mouth daily.    [provider]  busPIRone (BUSPAR) 5 MG tablet TAKE 1 TABLET BY MOUTH THREE TIMES A DAY 11/22/19   Jacelyn Pi, Lilia Argue, MD  cholecalciferol (VITAMIN D3) 25 MCG (1000 UNIT) tablet Take 1,000 Units by mouth daily.    [provider]  desvenlafaxine (PRISTIQ) 100 MG 24 hr tablet Take 1 tablet (100 mg total) by mouth daily. 10/08/19   Jacelyn Pi, Lilia Argue, MD  fluticasone [facility]) 50 MCG/ACT nasal spray Place 1 spray into both nostrils 2 (two)  times daily. 06/21/19   Jacelyn Pi, Lilia Argue, MD  ibuprofen (ADVIL) 400 MG tablet Take 400 mg by mouth every 6 (six) hours as needed.    [provider]  lisinopril-hydrochlorothiazide (ZESTORETIC) 10-12.5 MG tablet TAKE 1 TABLET BY MOUTH EVERY DAY 11/22/19   Jacelyn Pi, Lilia Argue, MD  rivaroxaban (XARELTO) 10 MG TABS tablet Take 1 tablet (10 mg total) by mouth daily. 08/30/19   Corky Sing, PA-C  senna (SENOKOT) 8.6 MG TABS tablet Take 2 tablets (17.2 mg total) by mouth 2 (two) times daily. 08/30/19   Corky Sing, PA-C  SPRINTEC 28 0.25-35 MG-MCG tablet Take 1 tablet by mouth daily. 05/08/19   [provider]    Family History Family History  Problem Relation Age of Onset   Depression Mother     Kidney disease Mother    Liver disease Mother    Diverticulitis Mother    Heart failure Father    Diabetes Father    Psychiatric Illness Sister     Social History Social History   Tobacco Use   Smoking status: Never   Smokeless tobacco: Never  Substance Use Topics   Alcohol use: Yes    Comment: occasional   Drug use: Never     Allergies   Patient has no known allergies.   Review of Systems Review of Systems Per HPI  Physical Exam Triage Vital Signs ED Triage Vitals  Enc Vitals Group     BP 05/05/21 1256 (!) 150/98     Pulse Rate 05/05/21 1256 86     Resp 05/05/21 1256 18     Temp 05/05/21 1256 98.8 F (37.1 C)     Temp Source 05/05/21 1256 Oral     SpO2 05/05/21 1256 98 %     Weight --      Height --      Head Circumference --      Peak Flow --      Pain Score 05/05/21 1241 5     Pain Loc --      Pain Edu? --      Excl. in [place]? --    No data found.  Updated Vital Signs BP (!) 150/98 (BP Location: Left Arm)    Pulse 86    Temp 98.8 F (37.1 C) (Oral)    Resp 18    SpO2 98%   Visual Acuity Right Eye Distance:   Left Eye Distance:   Bilateral Distance:    Right Eye Near:   Left Eye Near:    Bilateral Near:     Physical Exam Constitutional:      General: He is not in acute distress.    Appearance: Normal appearance. He is not toxic-appearing or diaphoretic.  HENT:     Head: Normocephalic and atraumatic.  Eyes:     Extraocular Movements: Extraocular movements intact.     Conjunctiva/sclera: Conjunctivae normal.  Cardiovascular:     Rate and Rhythm: Normal rate and regular rhythm.     Pulses: Normal pulses.     Heart sounds: Normal heart sounds.  Pulmonary:     Effort: Pulmonary effort is normal. No respiratory distress.     Breath sounds: Normal breath sounds.  Neurological:     General: No focal deficit present.     Mental Status: He is alert and oriented to person, place, and time. Mental status is at baseline.     Cranial Nerves: Cranial  nerves 2-12 are intact.  Sensory: Sensation is intact.     Motor: Motor function is intact.     Coordination: Coordination is intact.     Gait: Gait is intact.  Psychiatric:        Mood and Affect: Mood normal.        Behavior: Behavior normal.        Thought Content: Thought content normal.        Judgment: Judgment normal.     UC Treatments / Results  Labs (all labs ordered are listed, but only abnormal results are displayed) Labs Reviewed - No data to display  EKG   Radiology No results found.  Procedures Procedures (including critical care time)  Medications Ordered in UC Medications - No data to display  Initial Impression / Assessment and Plan / UC Course  I have reviewed the triage vital signs and the nursing notes.  Pertinent labs & imaging results that were available during my care of the patient were reviewed by me and considered in my medical decision making (see chart for details).     EKG sinus rhythm with nonspecific T wave abnormality.  There is suspicion that patient's symptoms could be related to anxiety given her history, but I do think the patient needs more extensive evaluation given current symptoms as it could be related to cardiac etiology.  Advised patient to go the ER for more extensive evaluation and management.  Patient was agreeable with plan.  Suggested EMS transport given chest pain but patient declined.  Risks associated with not going to hospital via EMS were discussed with patient.  Patient voiced understanding.  Patient left with her husband transporting her to the hospital. Final Clinical Impressions(s) / UC Diagnoses   Final diagnoses:  Other chest pain  Jaw pain  Left arm pain     Discharge Instructions      Please go to the hospital as soon as you leave urgent care for further evaluation and management.    ED Prescriptions   None    PDMP not reviewed this encounter.   Teodora Medici, [place] 05/05/21 1324

## 2021-05-05 NOTE — Discharge Instructions (Signed)
Please use ibuprofen (Motrin) up to 800 mg every 8 hours, naproxen (Naprosyn) up to 500 mg every 12 hours, and/or acetaminophen (Tylenol) up to 4 g/day for any continued pain 

## 2021-05-05 NOTE — ED Triage Notes (Signed)
Pt here with CP that started Sat but has been consistent until today. Pt also having left arm pain traveling up to her neck that started today. Pt denies N/V but diarrhea yesterday. Pt states she is very tired but in no distress in triage.

## 2021-06-09 ENCOUNTER — Other Ambulatory Visit: Payer: Self-pay

## 2021-06-09 ENCOUNTER — Ambulatory Visit
Admission: EM | Admit: 2021-06-09 | Discharge: 2021-06-09 | Disposition: A | Discharge status: Home / Self Care | Payer: BC Managed Care – PPO

## 2021-06-09 DIAGNOSIS — J019 Acute sinusitis, unspecified: Secondary | ICD-10-CM

## 2021-06-09 MED ORDER — AMOXICILLIN-POT CLAVULANATE 875-125 MG PO TABS: 1.0000 | ORAL_TABLET | Freq: Two times a day (BID) | ORAL | 0 refills | Status: DC

## 2021-06-09 NOTE — ED Provider Notes (Signed)
?[name] ? ? ? ?CSN: [id number] ?Arrival date & time: 06/09/21  1031 ? ? ?  ? ?History   ?Chief Complaint ?Chief Complaint  ?Patient presents with  ? Otalgia  ?  bilateral  ? ? ?HPI ?[name] is a 41 y.o. adult.  ? ?Patient here today for evaluation of sinus pressure, cough and bilateral ear pain that has been worsening over the last week. She has not had fever. She has had some fatigue and diarrhea. She has tried OTC meds without relief. She denies any nausea or vomiting.  ? ?The history is provided by the patient.  ? ?Past Medical History:  ?Diagnosis Date  ? Anxiety   ? xanax prn  ? Complication of anesthesia   ? feels panic with face mask  ? Depression   ? Headache(784.0)   ? hx migraines  ? Hiatal hernia   ? Hypertension   ? previously on BP med - none currently  ? Recurrent dry cough   ? Sleep apnea   ? unable to tolerate mask   ? ? ?Patient Active Problem List  ? Diagnosis Date Noted  ? Anxiety and depression 08/06/2019  ? Vitamin D deficiency 08/06/2019  ? Abnormal cervical Papanicolaou smear 05/23/2018  ? 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 Morbid obesity ([facility]) 06/21/2013  ? ? ?Past Surgical History:  ?Procedure Laterality Date  ? APPENDECTOMY    ? BREATH TEK H PYLORI N/A 07/20/2013  ? Procedure: BREATH TEK H PYLORI;  Surgeon: Kandis Cockingavid H Newman, MD;  [facility];  Service: General;  Laterality: N/A;  ? CHOLECYSTECTOMY    ? LAPAROSCOPIC GASTRIC SLEEVE RESECTION N/A 09/18/2013  ? Procedure: LAPAROSCOPIC GASTRIC SLEEVE RESECTION UPPER ENDOSCOPY;  Surgeon: Kandis Cockingavid H Newman, MD;  Location: [facility];  Service: General;  Laterality: N/A;  ? ORIF ANKLE FRACTURE Left 08/30/2019  ? Procedure: OPEN REDUCTION INTERNAL FIXATION (ORIF) Left ankle lateral malleolus fracture;  Surgeon: Toni ArthursHewitt, John, MD;  Location: [facility];  Service: Orthopedics;  Laterality: Left;  60min  ? ? ?OB History   ?No obstetric history on file. ?  ? ? ? ?Home Medications   ? ?Prior to  Admission medications   ?Medication Sig Start Date End Date Taking? Authorizing Provider  ?amoxicillin-clavulanate (AUGMENTIN) 875-125 MG tablet Take 1 tablet by mouth every 12 (twelve) hours. 06/09/21  Yes Tomi BambergerMyers, Litha Lamartina F, PA-C  ?acetaminophen (TYLENOL) 325 MG tablet Take 650 mg by mouth every 6 (six) hours as needed.    [provider]  ?ALPRAZolam Prudy Feeler(XANAX) 0.5 MG tablet Take 1 tablet (0.5 mg total) by mouth 2 (two) times daily as needed. 08/31/19   Noni SaupeSantiago Lago, Irma M, MD  ?aspirin EC 81 MG tablet Take 81 mg by mouth daily.    [provider]  ?busPIRone (BUSPAR) 5 MG tablet TAKE 1 TABLET BY MOUTH THREE TIMES A DAY 11/22/19   Lezlie LyeSantiago Lago, Meda CoffeeIrma M, MD  ?cholecalciferol (VITAMIN D3) 25 MCG (1000 UNIT) tablet Take 1,000 Units by mouth daily.    [provider]  ?desvenlafaxine (PRISTIQ) 100 MG 24 hr tablet Take 1 tablet (100 mg total) by mouth daily. 10/08/19   Noni SaupeSantiago Lago, Irma M, MD  ?FLUoxetine (PROZAC) 40 MG capsule Take 40 mg by mouth daily. 05/10/21   [provider]  ?fluticasone (FLONASE) 50 MCG/ACT nasal spray Place 1 spray into both nostrils 2 (two) times daily. 06/21/19   Noni SaupeSantiago Lago, Irma M, MD  ?ibuprofen (ADVIL) 400 MG tablet Take 400  mg by mouth every 6 (six) hours as needed.    [provider]  ?lisinopril-hydrochlorothiazide (ZESTORETIC) 10-12.5 MG tablet TAKE 1 TABLET BY MOUTH EVERY DAY 11/22/19   Lezlie Lye, Meda Coffee, MD  ?rivaroxaban (XARELTO) 10 MG TABS tablet Take 1 tablet (10 mg total) by mouth daily. 08/30/19   Jacinta Shoe, PA-C  ?senna (SENOKOT) 8.6 MG TABS tablet Take 2 tablets (17.2 mg total) by mouth 2 (two) times daily. 08/30/19   Jacinta Shoe, PA-C  ?SPRINTEC 28 0.25-35 MG-MCG tablet Take 1 tablet by mouth daily. 05/08/19   [provider]  ?temazepam (RESTORIL) 15 MG capsule Take 15-30 mg by mouth at bedtime as needed. 04/07/21   [provider]  ? ? ?Family History ?Family History  ?Problem Relation Age of Onset  ?  Depression Mother   ? Kidney disease Mother   ? Liver disease Mother   ? Diverticulitis Mother   ? Heart failure Father   ? Diabetes Father   ? Psychiatric Illness Sister   ? ? ?Social History ?Social History  ? ?Tobacco Use  ? Smoking status: Never  ? Smokeless tobacco: Never  ?Substance Use Topics  ? Alcohol use: Yes  ?  Comment: occasional  ? Drug use: Never  ? ? ? ?Allergies   ?Patient has no known allergies. ? ? ?Review of Systems ?Review of Systems  ?Constitutional:  Negative for chills and fever.  ?HENT:  Positive for congestion, ear pain and sinus pressure. Negative for sore throat.   ?Eyes:  Negative for discharge and redness.  ?Respiratory:  Positive for cough. Negative for shortness of breath and wheezing.   ?Gastrointestinal:  Positive for diarrhea. Negative for abdominal pain, nausea and vomiting.  ? ? ?Physical Exam ?Triage Vital Signs ?ED Triage Vitals  ?Enc Vitals Group  ?   BP   ?   Pulse   ?   Resp   ?   Temp   ?   Temp src   ?   SpO2   ?   Weight   ?   Height   ?   Head Circumference   ?   Peak Flow   ?   Pain Score   ?   Pain Loc   ?   Pain Edu?   ?   Excl. in GC?   ? ?No data found. ? ?Updated Vital Signs ?BP (!) 163/103 (BP Location: Left Arm) Comment: Provider requested recheck. Informed provider of reading  Pulse 77   Temp 98.3 ?F (36.8 ?C) (Oral)   Resp 18   SpO2 99%  ?   ? ?Physical Exam ?Vitals and nursing note reviewed.  ?Constitutional:   ?   General: He is not in acute distress. ?   Appearance: Normal appearance. He is not ill-appearing.  ?HENT:  ?   Head: Normocephalic and atraumatic.  ?   Nose: Congestion present.  ?   Mouth/Throat:  ?   Mouth: Mucous membranes are moist.  ?Eyes:  ?   Conjunctiva/sclera: Conjunctivae normal.  ?Cardiovascular:  ?   Rate and Rhythm: Normal rate and regular rhythm.  ?   Heart sounds: Normal heart sounds. No murmur heard. ?Pulmonary:  ?   Effort: Pulmonary effort is normal. No respiratory distress.  ?   Breath sounds: Normal breath sounds. No  wheezing, rhonchi or rales.  ?Skin: ?   General: Skin is warm and dry.  ?Neurological:  ?   Mental Status: He is alert.  ?Psychiatric:     ?  Mood and Affect: Mood normal.     ?   Thought Content: Thought content normal.  ? ? ? ?UC Treatments / Results  ?Labs ?(all labs ordered are listed, but only abnormal results are displayed) ?Labs Reviewed - No data to display ? ?EKG ? ? ?Radiology ?No results found. ? ?Procedures ?Procedures (including critical care time) ? ?Medications Ordered in UC ?Medications - No data to display ? ?Initial Impression / Assessment and Plan / UC Course  ?I have reviewed the triage vital signs and the nursing notes. ? ?Pertinent labs & imaging results that were available during my care of the patient were reviewed by me and considered in my medical decision making (see chart for details). ? ?  ?Will treat to cover sinusitis with augmentin. Notified of elevated BP and recommended she follow up with PCP. I suspect some elevation may be due to OTC meds. Encouraged follow up with any further concerns.  ? ?Final Clinical Impressions(s) / UC Diagnoses  ? ?Final diagnoses:  ?Acute sinusitis, recurrence not specified, unspecified location  ? ?Discharge Instructions   ?None ?  ? ?ED Prescriptions   ? ? Medication Sig Dispense Auth. Provider  ? amoxicillin-clavulanate (AUGMENTIN) 875-125 MG tablet Take 1 tablet by mouth every 12 (twelve) hours. 14 tablet Tomi Bamberger, PA-C  ? ?  ? ?PDMP not reviewed this encounter. ?  ?Tomi Bamberger, PA-C ?06/09/21 1329 ? ?

## 2021-06-09 NOTE — ED Triage Notes (Signed)
One week h/o sinus pressure, cough and bilateral ear pain (left >right). Afebrile. Confirms fatigue and diarrhea. Has been taking otc meds w/or relief. No emesis. ?

## 2021-06-27 ENCOUNTER — Ambulatory Visit
Admission: RE | Admit: 2021-06-27 | Discharge: 2021-06-27 | Disposition: A | Discharge status: Home / Self Care | Payer: BC Managed Care – PPO | Source: Ambulatory Visit | Attending: Obstetrics and Gynecology | Admitting: Obstetrics and Gynecology

## 2021-06-27 DIAGNOSIS — R928 Other abnormal and inconclusive findings on diagnostic imaging of breast: Secondary | ICD-10-CM

## 2021-07-02 ENCOUNTER — Other Ambulatory Visit: Payer: Self-pay | Admitting: Obstetrics and Gynecology

## 2021-07-02 ENCOUNTER — Other Ambulatory Visit: Payer: Self-pay | Admitting: Family Medicine

## 2021-07-02 DIAGNOSIS — R921 Mammographic calcification found on diagnostic imaging of breast: Secondary | ICD-10-CM

## 2021-10-07 ENCOUNTER — Telehealth: Payer: Self-pay

## 2021-10-07 NOTE — Telephone Encounter (Signed)
NOTES SCANNED TO REFERRAL 

## 2021-11-12 ENCOUNTER — Ambulatory Visit: Payer: BC Managed Care – PPO | Admitting: Nurse Practitioner

## 2021-12-30 ENCOUNTER — Other Ambulatory Visit: Payer: Self-pay | Admitting: Obstetrics and Gynecology

## 2021-12-30 ENCOUNTER — Ambulatory Visit
Admission: RE | Admit: 2021-12-30 | Discharge: 2021-12-30 | Disposition: A | Discharge status: Home / Self Care | Payer: BC Managed Care – PPO | Source: Ambulatory Visit | Attending: Obstetrics and Gynecology | Admitting: Obstetrics and Gynecology

## 2021-12-30 DIAGNOSIS — R921 Mammographic calcification found on diagnostic imaging of breast: Secondary | ICD-10-CM

## 2022-01-08 ENCOUNTER — Ambulatory Visit: Payer: BC Managed Care – PPO | Admitting: Nurse Practitioner

## 2022-01-08 ENCOUNTER — Other Ambulatory Visit: Payer: Self-pay | Admitting: Obstetrics and Gynecology

## 2022-01-08 ENCOUNTER — Encounter: Payer: Self-pay | Admitting: Nurse Practitioner

## 2022-01-08 VITALS — BP 138/96 | HR 75 | Temp 98.2°F | Ht 67.0 in | Wt 265.0 lb

## 2022-01-08 DIAGNOSIS — E785 Hyperlipidemia, unspecified: Secondary | ICD-10-CM | POA: Insufficient documentation

## 2022-01-08 DIAGNOSIS — Z1159 Encounter for screening for other viral diseases: Secondary | ICD-10-CM | POA: Insufficient documentation

## 2022-01-08 DIAGNOSIS — E559 Vitamin D deficiency, unspecified: Secondary | ICD-10-CM | POA: Diagnosis not present

## 2022-01-08 DIAGNOSIS — I10 Essential (primary) hypertension: Secondary | ICD-10-CM

## 2022-01-08 DIAGNOSIS — Z23 Encounter for immunization: Secondary | ICD-10-CM | POA: Diagnosis not present

## 2022-01-08 DIAGNOSIS — F419 Anxiety disorder, unspecified: Secondary | ICD-10-CM

## 2022-01-08 DIAGNOSIS — F32A Depression, unspecified: Secondary | ICD-10-CM

## 2022-01-08 DIAGNOSIS — R921 Mammographic calcification found on diagnostic imaging of breast: Secondary | ICD-10-CM

## 2022-01-08 DIAGNOSIS — H9193 Unspecified hearing loss, bilateral: Secondary | ICD-10-CM

## 2022-01-08 LAB — COMPREHENSIVE METABOLIC PANEL
ALT: 13 U/L (ref 0–35)
AST: 17 U/L (ref 0–37)
Albumin: 4 g/dL (ref 3.5–5.2)
Alkaline Phosphatase: 112 U/L (ref 39–117)
BUN: 10 mg/dL (ref 6–23)
CO2: 25 mEq/L (ref 19–32)
Calcium: 9.2 mg/dL (ref 8.4–10.5)
Chloride: 105 mEq/L (ref 96–112)
Creatinine, Ser: 0.73 mg/dL (ref 0.40–1.20)
GFR: 100.64 mL/min (ref >60.00)
Glucose, Bld: 88 mg/dL (ref 70–99)
Potassium: 4.1 mEq/L (ref 3.5–5.1)
Sodium: 136 mEq/L (ref 135–145)
Total Bilirubin: 0.6 mg/dL (ref 0.2–1.2)
Total Protein: 7.3 g/dL (ref 6.0–8.3)

## 2022-01-08 LAB — LIPID PANEL
Cholesterol: 240 mg/dL — ABNORMAL HIGH (ref 0–200)
HDL: 57.5 mg/dL (ref >39.00)
LDL Cholesterol: 158 mg/dL — ABNORMAL HIGH (ref 0–99)
NonHDL: 182.57
Total CHOL/HDL Ratio: 4
Triglycerides: 123 mg/dL (ref 0.0–149.0)
VLDL: 24.6 mg/dL (ref 0.0–40.0)

## 2022-01-08 LAB — HEMOGLOBIN A1C: Hgb A1c MFr Bld: 5.1 % (ref 4.6–6.5)

## 2022-01-08 LAB — TSH: TSH: 1.37 u[IU]/mL (ref 0.35–5.50)

## 2022-01-08 LAB — VITAMIN D 25 HYDROXY (VIT D DEFICIENCY, FRACTURES): VITD: 20.02 ng/mL — ABNORMAL LOW (ref 30.00–100.00)

## 2022-01-08 MED ORDER — LABETALOL HCL 100 MG PO TABS: 100.0000 mg | ORAL_TABLET | Freq: Two times a day (BID) | ORAL | 2 refills | Status: DC

## 2022-01-08 MED ORDER — FLUOXETINE HCL 20 MG PO CAPS: 60.0000 mg | ORAL_CAPSULE | Freq: Every day | ORAL | 3 refills | Status: DC

## 2022-01-08 NOTE — Assessment & Plan Note (Signed)
Currently not well controlled, patient not willing to participate in counseling at this time.  Per shared decision making will increase Prozac to 60 mg by mouth daily, patient warned of risk for suicidal ideation and to let me know if this were to occur.  Follow-up in 4 weeks to see how she is tolerating dosage increase.  Consider referral to psychiatry.

## 2022-01-08 NOTE — Assessment & Plan Note (Signed)
Chronic, patient to follow-up with Dr. Debara Pickett as scheduled.

## 2022-01-08 NOTE — Assessment & Plan Note (Signed)
Referral to ENT made today.

## 2022-01-08 NOTE — Assessment & Plan Note (Signed)
Chronic, mild.  Patient not on contraception and is sexually active will initiate on labetalol 100 mg twice a day.  Patient to monitor blood pressure periodically at home and follow-up in 4 weeks for further evaluation.

## 2022-01-08 NOTE — Assessment & Plan Note (Signed)
Hep C antibody ordered, further recommendations may be made based upon these results. 

## 2022-01-08 NOTE — Progress Notes (Signed)
New Patient Office Visit  Subjective    Patient ID: [name], adult    DOB: 12-17-1980  Age: 41 y.o. MRN: [id number]  CC:  Chief Complaint  Patient presents with   New Patient (Initial Visit)    BP, cholesterol and talk about medication and also hearing     HPI [name] presents to establish care Has mainly been seeing her OB/GYN for her health care up until now.  She has a few concerns as listed below.  Hyperlipidemia: Has personal history of this, has been referred to Dr. Rennis Golden by OB/GYN.  Per chart review I see last LDL in our system was greater than 170 this was collected in 2021.  Reports significant family history for hyperlipidemia, denies family history of early onset of heart attack or stroke.  Not currently on medication for cholesterol.  Not currently on contraception, sexually active only periodically.  Hypertension: Reports personal history of hypertension, has been on and off antihypertensives in the past.  Evidence in the chart that she has been on lisinopril-hydrochlorothiazide in the past.  Reports having had recent stress headaches.  Denies chest pain, does however have shortness of breath and dizziness at times.  Tinnitus: Occurs constantly, only notices it when it is really quiet.  Has also noticed that she has had issues with hearing and is requiring use of close caption when watching TV.  Sleeps with sound machine or fan at night.  Anxiety and depression: Personal history of anxiety and depression, has tried Zoloft, Lexapro, Wellbutrin, Paxil, Pristiq, Effexor and currently on Prozac 40 mg by mouth daily.  She feels that her anxiety depression are not currently well controlled, denies suicidal ideation.  Experiences social anxiety, this is affecting her ability to go into work.  She often reports waking up early in the morning between 2 AM and 3 AM.  Takes temazepam at night to help with sleep.  Outpatient Encounter Medications as of 01/08/2022  Medication Sig    ALPRAZolam (XANAX) 0.5 MG tablet Take 1 tablet (0.5 mg total) by mouth 2 (two) times daily as needed.   FLUoxetine (PROZAC) 20 MG capsule Take 3 capsules (60 mg total) by mouth daily.   labetalol (NORMODYNE) 100 MG tablet Take 1 tablet (100 mg total) by mouth 2 (two) times daily.   temazepam (RESTORIL) 15 MG capsule Take 15-30 mg by mouth at bedtime as needed.   [DISCONTINUED] FLUoxetine (PROZAC) 40 MG capsule Take 40 mg by mouth daily.   acetaminophen (TYLENOL) 325 MG tablet Take 650 mg by mouth every 6 (six) hours as needed. (Patient not taking: Reported on 01/08/2022)   ibuprofen (ADVIL) 400 MG tablet Take 400 mg by mouth every 6 (six) hours as needed. (Patient not taking: Reported on 01/08/2022)   [DISCONTINUED] amoxicillin-clavulanate (AUGMENTIN) 875-125 MG tablet Take 1 tablet by mouth every 12 (twelve) hours. (Patient not taking: Reported on 01/08/2022)   [DISCONTINUED] aspirin EC 81 MG tablet Take 81 mg by mouth daily. (Patient not taking: Reported on 01/08/2022)   [DISCONTINUED] busPIRone (BUSPAR) 5 MG tablet TAKE 1 TABLET BY MOUTH THREE TIMES A DAY (Patient not taking: Reported on 01/08/2022)   [DISCONTINUED] cholecalciferol (VITAMIN D3) 25 MCG (1000 UNIT) tablet Take 1,000 Units by mouth daily. (Patient not taking: Reported on 01/08/2022)   [DISCONTINUED] desvenlafaxine (PRISTIQ) 100 MG 24 hr tablet Take 1 tablet (100 mg total) by mouth daily. (Patient not taking: Reported on 01/08/2022)   [DISCONTINUED] fluticasone (FLONASE) 50 MCG/ACT nasal spray Place 1 spray into  both nostrils 2 (two) times daily. (Patient not taking: Reported on 01/08/2022)   [DISCONTINUED] lisinopril-hydrochlorothiazide (ZESTORETIC) 10-12.5 MG tablet TAKE 1 TABLET BY MOUTH EVERY DAY (Patient not taking: Reported on 01/08/2022)   [DISCONTINUED] rivaroxaban (XARELTO) 10 MG TABS tablet Take 1 tablet (10 mg total) by mouth daily. (Patient not taking: Reported on 01/08/2022)   [DISCONTINUED] senna (SENOKOT) 8.6 MG TABS  tablet Take 2 tablets (17.2 mg total) by mouth 2 (two) times daily. (Patient not taking: Reported on 01/08/2022)   [DISCONTINUED] SPRINTEC 28 0.25-35 MG-MCG tablet Take 1 tablet by mouth daily. (Patient not taking: Reported on 01/08/2022)   No facility-administered encounter medications on file as of 01/08/2022.    Past Medical History:  Diagnosis Date   Anxiety    xanax prn   Closed fracture of lateral malleolus 09/10/2019   Complication of anesthesia    feels panic with face mask   Depression    Headache(784.0)    hx migraines   Hiatal hernia    Hypertension    previously on BP med - none currently   Recurrent dry cough    Sleep apnea    unable to tolerate mask     Past Surgical History:  Procedure Laterality Date   APPENDECTOMY     BREATH TEK H PYLORI N/A 07/20/2013   Procedure: BREATH TEK H PYLORI;  Surgeon: Kandis Cocking, MD;  Location: [facility];  Service: General;  Laterality: N/A;   CHOLECYSTECTOMY     LAPAROSCOPIC GASTRIC SLEEVE RESECTION N/A 09/18/2013   Procedure: LAPAROSCOPIC GASTRIC SLEEVE RESECTION UPPER ENDOSCOPY;  Surgeon: Kandis Cocking, MD;  Location: [facility];  Service: General;  Laterality: N/A;   ORIF ANKLE FRACTURE Left 08/30/2019   Procedure: OPEN REDUCTION INTERNAL FIXATION (ORIF) Left ankle lateral malleolus fracture;  Surgeon: Toni Arthurs, MD;  Location: [facility];  Service: Orthopedics;  Laterality: Left;     Family History  Problem Relation Age of Onset   Depression Mother    Kidney disease Mother    Liver disease Mother    Diverticulitis Mother    Heart failure Father    Diabetes Father    Psychiatric Illness Sister    Breast cancer Neg Hx     Social History   Socioeconomic History   Marital status: Married    Spouse name: Not on file   Number of children: Not on file   Years of education: Not on file   Highest education level: Not on file  Occupational History   Not on file  Tobacco Use   Smoking status:  Never   Smokeless tobacco: Never  Substance and Sexual Activity   Alcohol use: Yes    Comment: occasional   Drug use: Never   Sexual activity: Yes    Partners: Male  Other Topics Concern   Not on file  Social History Narrative   Not on file   Social Determinants of Health   Financial Resource Strain: Not on file  Food Insecurity: Not on file  Transportation Needs: Not on file  Physical Activity: Not on file  Stress: Not on file  Social Connections: Not on file  Intimate Partner Violence: Not on file    Review of Systems  Constitutional:  Positive for malaise/fatigue (exertional fatigue).  Eyes:  Positive for blurred vision (reports history of "bad" eyesight) and photophobia (with headaches sometimes). Negative for double vision.  Respiratory:  Positive for shortness of breath.   Cardiovascular:  Negative for chest pain.  Gastrointestinal:  Negative for nausea and vomiting.  Neurological:  Positive for dizziness and headaches.        Objective    BP (!) 138/96   Pulse 75   Temp 98.2 F (36.8 C) (Oral)   Ht 5\' 7"  (1.702 m)   Wt 265 lb (120.2 kg)   SpO2 96%   BMI 41.50 kg/m      01/08/2022    2:53 PM 08/16/2019   11:43 AM 08/06/2019   11:32 AM  PHQ9 SCORE ONLY  PHQ-9 Total Score 16 0 4      01/08/2022    3:11 PM 08/16/2019   11:43 AM 08/06/2019   11:33 AM 06/21/2019   12:08 PM  GAD 7 : Generalized Anxiety Score  Nervous, Anxious, on Edge 3 0 1 1  Control/stop worrying 2 0 0 1  Worry too much - different things 2 0 1 1  Trouble relaxing 3 0 0 3  Restless 3 0 0 1  Easily annoyed or irritable 3 0 1 3  Afraid - awful might happen 0 0 0 0  Total GAD 7 Score 16 0 3 10  Anxiety Difficulty  Not difficult at all Not difficult at all Very difficult      Physical Exam Vitals reviewed.  Constitutional:      General: He is not in acute distress.    Appearance: Normal appearance.  HENT:     Head: Normocephalic and atraumatic.     Right Ear: Tympanic  membrane, ear canal and external ear normal.     Left Ear: Tympanic membrane, ear canal and external ear normal.     Ears:     Comments: Passed whisper test bilaterally Neck:     Vascular: No carotid bruit.  Cardiovascular:     Rate and Rhythm: Normal rate and regular rhythm.     Pulses: Normal pulses.     Heart sounds: Normal heart sounds.  Pulmonary:     Effort: Pulmonary effort is normal.     Breath sounds: Normal breath sounds.  Skin:    General: Skin is warm and dry.  Neurological:     General: No focal deficit present.     Mental Status: He is alert and oriented to person, place, and time.  Psychiatric:        Mood and Affect: Mood normal.        Behavior: Behavior normal.        Judgment: Judgment normal.         Assessment & Plan:   Problem List Items Addressed This Visit       Cardiovascular and Mediastinum   Hypertension    Chronic, mild.  Patient not on contraception and is sexually active will initiate on labetalol 100 mg twice a day.  Patient to monitor blood pressure periodically at home and follow-up in 4 weeks for further evaluation.      Relevant Medications   labetalol (NORMODYNE) 100 MG tablet   Other Relevant Orders   Comprehensive metabolic panel   CBC     Nervous and Auditory   Bilateral hearing loss    Referral to ENT made today.      Relevant Orders   Ambulatory referral to ENT     Other   Morbid obesity ([place])    Labs ordered today, further recommendations may be made based upon his results.      Relevant Orders   TSH   Hemoglobin A1c   Lipid panel  CBC   Anxiety and depression    Currently not well controlled, patient not willing to participate in counseling at this time.  Per shared decision making will increase Prozac to 60 mg by mouth daily, patient warned of risk for suicidal ideation and to let me know if this were to occur.  Follow-up in 4 weeks to see how she is tolerating dosage increase.  Consider referral to  psychiatry.      Relevant Medications   FLUoxetine (PROZAC) 20 MG capsule   Vitamin D deficiency    Reported that she has been told she has vitamin D deficiency in the past, takes supplement periodically.  We will check serum level today, further recommendations may be made based upon these results.      Relevant Orders   Comprehensive metabolic panel   CBC   VITAMIN D 25 Hydroxy (Vit-D Deficiency, Fractures)   Hyperlipidemia - Primary    Chronic, patient to follow-up with Dr. Rennis Golden as scheduled.      Relevant Medications   labetalol (NORMODYNE) 100 MG tablet   Other Relevant Orders   Lipid panel   CBC   Need for vaccination    Flu shot administered, VIS provided.      Relevant Orders   Flu Vaccine QUAD 6+ mos PF IM (Fluarix Quad PF) (Completed)   Encounter for hepatitis C screening test for low risk patient    Hep C antibody ordered, further recommendations may be made based upon these results.      Relevant Orders   Hepatitis C antibody    Return in about 4 weeks (around 02/05/2022) for F/U with Maralyn Sago - due for tetanus shot.   Elenore Paddy, NP

## 2022-01-08 NOTE — Assessment & Plan Note (Signed)
Reported that she has been told she has vitamin D deficiency in the past, takes supplement periodically.  We will check serum level today, further recommendations may be made based upon these results.

## 2022-01-08 NOTE — Assessment & Plan Note (Signed)
Labs ordered today, further recommendations may be made based upon his results. 

## 2022-01-08 NOTE — Assessment & Plan Note (Signed)
Flu shot administered, VIS provided. 

## 2022-01-11 LAB — CBC
HCT: 40.2 % (ref 36.0–46.0)
Hemoglobin: 13.2 g/dL (ref 12.0–15.0)
MCHC: 32.8 g/dL (ref 30.0–36.0)
MCV: 91 fl (ref 78.0–100.0)
Platelets: 402 10*3/uL — ABNORMAL HIGH (ref 150.0–400.0)
RBC: 4.42 Mil/uL (ref 3.87–5.11)
RDW: 12.1 % (ref 11.5–15.5)
WBC: 9.2 10*3/uL (ref 4.0–10.5)

## 2022-01-11 LAB — HEPATITIS C ANTIBODY: Hepatitis C Ab: NONREACTIVE

## 2022-01-13 ENCOUNTER — Ambulatory Visit
Admission: RE | Admit: 2022-01-13 | Discharge: 2022-01-13 | Disposition: A | Discharge status: Home / Self Care | Payer: BC Managed Care – PPO | Source: Ambulatory Visit | Attending: Obstetrics and Gynecology | Admitting: Obstetrics and Gynecology

## 2022-01-13 DIAGNOSIS — R921 Mammographic calcification found on diagnostic imaging of breast: Secondary | ICD-10-CM

## 2022-02-04 ENCOUNTER — Ambulatory Visit: Payer: BC Managed Care – PPO | Admitting: Nurse Practitioner

## 2022-02-11 ENCOUNTER — Encounter: Payer: Self-pay | Admitting: Nurse Practitioner

## 2022-02-11 ENCOUNTER — Ambulatory Visit: Payer: BC Managed Care – PPO | Admitting: Nurse Practitioner

## 2022-02-11 VITALS — BP 120/70 | HR 79 | Temp 98.0°F | Ht 67.0 in | Wt 266.0 lb

## 2022-02-11 DIAGNOSIS — F32A Depression, unspecified: Secondary | ICD-10-CM | POA: Diagnosis not present

## 2022-02-11 DIAGNOSIS — G4700 Insomnia, unspecified: Secondary | ICD-10-CM

## 2022-02-11 DIAGNOSIS — F419 Anxiety disorder, unspecified: Secondary | ICD-10-CM

## 2022-02-11 DIAGNOSIS — Z23 Encounter for immunization: Secondary | ICD-10-CM | POA: Diagnosis not present

## 2022-02-11 DIAGNOSIS — E559 Vitamin D deficiency, unspecified: Secondary | ICD-10-CM | POA: Diagnosis not present

## 2022-02-11 LAB — BASIC METABOLIC PANEL
BUN: 7 mg/dL (ref 6–23)
CO2: 26 mEq/L (ref 19–32)
Calcium: 8.7 mg/dL (ref 8.4–10.5)
Chloride: 106 mEq/L (ref 96–112)
Creatinine, Ser: 0.73 mg/dL (ref 0.40–1.20)
GFR: 100.57 mL/min (ref >60.00)
Glucose, Bld: 97 mg/dL (ref 70–99)
Potassium: 4.3 mEq/L (ref 3.5–5.1)
Sodium: 137 mEq/L (ref 135–145)

## 2022-02-11 LAB — VITAMIN D 25 HYDROXY (VIT D DEFICIENCY, FRACTURES): VITD: 32.31 ng/mL (ref 30.00–100.00)

## 2022-02-11 MED ORDER — TEMAZEPAM 30 MG PO CAPS: 30.0000 mg | ORAL_CAPSULE | Freq: Every evening | ORAL | 0 refills | Status: DC | PRN

## 2022-02-11 NOTE — Assessment & Plan Note (Signed)
Chronic, patient to continue taking vitamin D3 5000 IUs by mouth daily.  Check serum level vitamin D and calcium level today, further recommendations may be made based upon these results.

## 2022-02-11 NOTE — Assessment & Plan Note (Signed)
Chronic, improved compared to last office visit.  Last PHQ-9 score 16, today is 4.  Per shared decision making patient will continue on Prozac 60 mg mouth daily and continue taking Restoril 30 mg a mouth at bedtime.  We will give an additional 4 weeks, I will touch base with her at that point if she is still experiencing worsening depression or difficulty sleeping through the night we will have to discuss modifying treatment plan such as increasing Prozac to 80 mg daily versus trialing another medication such as hydroxyzine for sleep.  I encourage patient to consider evaluation with psychiatry, but patient would prefer not to do this at this time.

## 2022-02-11 NOTE — Assessment & Plan Note (Signed)
Tdap administered, VIS provided.

## 2022-02-11 NOTE — Assessment & Plan Note (Addendum)
Chronic, not well controlled currently.  Patient to continue taking temazepam 30 mg capsule at bedtime, she was again educated to avoid taking alprazolam with her temazepam.  She reports her understanding.  We will keep her on Prozac 60 mg mouth daily for an additional 4 weeks to see if upon reaching steady state whether or not sleeping is also improved.  If not, may consider tapering off of temazepam and trialing another medication such as hydroxyzine.  Patient reports understanding. Controlled substance database reviewed.

## 2022-02-11 NOTE — Progress Notes (Signed)
Established Patient Office Visit  Subjective   Patient ID: [name], adult    DOB: [birth year]  Age: 41 y.o. MRN: [id number]  Chief Complaint  Patient presents with   Depression    Depression, anxiety, insomnia: Increased dose of Prozac to 60 mg about 4 weeks ago.  Reports that she is feeling much better on this dose regarding her depression.  However during her menstrual period she experiences worsening depression and is wondering what she can do to treat this.  She also reports that she wakes up around 2:00 in the morning every morning with anxiety.  Currently taking Restoril 30 mg at bedtime.  Also has been prescribed alprazolam 0.5 mg twice a day, she is aware she should not mix Restoril and alprazolam.    Health maintenance: Due for Tdap.  Vitamin D deficiency: Patient reports taking 5000 IUs of vitamin D3 daily.  Last serum level was 20.    ROS:see HPI    Objective:     BP 120/70 (BP Location: Left Arm, Patient Position: Sitting, Cuff Size: Large)   Pulse 79   Temp 98 F (36.7 C) (Oral)   Ht 5\' 7"  (1.702 m)   Wt 266 lb (120.7 kg)   LMP 01/14/2022   SpO2 96%   BMI 41.66 kg/m       02/11/2022    8:05 AM 01/08/2022    2:53 PM 08/16/2019   11:43 AM  PHQ9 SCORE ONLY  PHQ-9 Total Score 4 16 0     Physical Exam Vitals reviewed.  Constitutional:      General: He is not in acute distress.    Appearance: Normal appearance.  HENT:     Head: Normocephalic and atraumatic.  Neck:     Vascular: No carotid bruit.  Cardiovascular:     Rate and Rhythm: Normal rate and regular rhythm.     Pulses: Normal pulses.     Heart sounds: Normal heart sounds.  Pulmonary:     Effort: Pulmonary effort is normal.     Breath sounds: Normal breath sounds.  Skin:    General: Skin is warm and dry.  Neurological:     General: No focal deficit present.     Mental Status: He is alert and oriented to person, place, and time.  Psychiatric:        Mood and Affect: Mood normal.         Behavior: Behavior normal.        Judgment: Judgment normal.      No results found for any visits on 02/11/22.    The 10-year ASCVD risk score (Arnett DK, et al., 2019) is: 1.1%    Assessment & Plan:   Problem List Items Addressed This Visit       Other   Anxiety and depression - Primary    Chronic, improved compared to last office visit.  Last PHQ-9 score 16, today is 4.  Per shared decision making patient will continue on Prozac 60 mg mouth daily and continue taking Restoril 30 mg a mouth at bedtime.  We will give an additional 4 weeks, I will touch base with her at that point if she is still experiencing worsening depression or difficulty sleeping through the night we will have to discuss modifying treatment plan such as increasing Prozac to 80 mg daily versus trialing another medication such as hydroxyzine for sleep.  I encourage patient to consider evaluation with psychiatry, but patient would prefer not to do this at this  time.      Relevant Medications   temazepam (RESTORIL) 30 MG capsule (Start on 02/20/2022)   Vitamin D deficiency    Chronic, patient to continue taking vitamin D3 5000 IUs by mouth daily.  Check serum level vitamin D and calcium level today, further recommendations may be made based upon these results.      Relevant Orders   VITAMIN D 25 Hydroxy (Vit-D Deficiency, Fractures)   Basic metabolic panel   Insomnia    Chronic, not well controlled currently.  Patient to continue taking temazepam 30 mg capsule at bedtime, she was again educated to avoid taking alprazolam with her temazepam.  She reports her understanding.  We will keep her on Prozac 60 mg mouth daily for an additional 4 weeks to see if upon reaching steady state whether or not sleeping is also improved.  If not, may consider tapering off of temazepam and trialing another medication such as hydroxyzine.  Patient reports understanding. Controlled substance database reviewed.       Relevant  Medications   temazepam (RESTORIL) 30 MG capsule (Start on 02/20/2022)   Need for Tdap vaccination    Tdap administered, VIS provided.      Relevant Orders   Tdap vaccine greater than or equal to 7yo IM (Completed)    Return in about 3 months (around 05/14/2022) for CPE with Maralyn Sago.    Elenore Paddy, NP

## 2022-02-12 ENCOUNTER — Ambulatory Visit: Payer: BC Managed Care – PPO | Admitting: Nurse Practitioner

## 2022-03-03 ENCOUNTER — Ambulatory Visit (HOSPITAL_BASED_OUTPATIENT_CLINIC_OR_DEPARTMENT_OTHER): Payer: BC Managed Care – PPO | Admitting: Internal Medicine

## 2022-03-03 ENCOUNTER — Telehealth (HOSPITAL_BASED_OUTPATIENT_CLINIC_OR_DEPARTMENT_OTHER): Payer: Self-pay

## 2022-03-03 ENCOUNTER — Encounter (HOSPITAL_BASED_OUTPATIENT_CLINIC_OR_DEPARTMENT_OTHER): Payer: Self-pay | Admitting: Internal Medicine

## 2022-03-03 VITALS — BP 122/86 | HR 80 | Ht 67.0 in | Wt 263.0 lb

## 2022-03-03 DIAGNOSIS — I10 Essential (primary) hypertension: Secondary | ICD-10-CM

## 2022-03-03 DIAGNOSIS — E785 Hyperlipidemia, unspecified: Secondary | ICD-10-CM

## 2022-03-03 DIAGNOSIS — Z8342 Family history of familial hypercholesterolemia: Secondary | ICD-10-CM

## 2022-03-03 NOTE — Patient Instructions (Signed)
Medication Instructions:  NO CHANGES today   Will depend on LPa + calcium score results  *If you need a refill on your cardiac medications before your next appointment, please call your pharmacy*   Lab Work: LPa today or as soon as able   FASTING lab work to check cholesterol about 1 week before next appointment   If you have labs (blood work) drawn today and your tests are completely normal, you will receive your results only by: MyChart Message (if you have MyChart) OR A paper copy in the mail If you have any lab test that is abnormal or we need to change your treatment, we will call you to review the results.   Testing/Procedures: Dr. Rennis Golden has ordered a CT coronary calcium score.   Test locations:  [facility]   This is $99 out of pocket.   Coronary CalciumScan A coronary calcium scan is an imaging test used to look for deposits of calcium and other fatty materials (plaques) in the inner lining of the blood vessels of the heart (coronary arteries). These deposits of calcium and plaques can partly clog and narrow the coronary arteries without producing any symptoms or warning signs. This puts a person at risk for a heart attack. This test can detect these deposits before symptoms develop. Tell a health care provider about: Any allergies you have. All medicines you are taking, including vitamins, herbs, eye drops, creams, and over-the-counter medicines. Any problems you or family members have had with anesthetic medicines. Any blood disorders you have. Any surgeries you have had. Any medical conditions you have. Whether you are pregnant or may be pregnant. What are the risks? Generally, this is a safe procedure. However, problems may occur, including: Harm to a pregnant woman and her unborn baby. This test involves the use of radiation. Radiation exposure can be dangerous to a pregnant woman and her unborn baby. If you are pregnant, you  generally should not have this procedure done. Slight increase in the risk of cancer. This is because of the radiation involved in the test. What happens before the procedure? No preparation is needed for this procedure. What happens during the procedure? You will undress and remove any jewelry around your neck or chest. You will put on a hospital gown. Sticky electrodes will be placed on your chest. The electrodes will be connected to an electrocardiogram (ECG) machine to record a tracing of the electrical activity of your heart. A CT scanner will take pictures of your heart. During this time, you will be asked to lie still and hold your breath for 2-3 seconds while a picture of your heart is being taken. The procedure may vary among health care providers and hospitals. What happens after the procedure? You can get dressed. You can return to your normal activities. It is up to you to get the results of your test. Ask your health care provider, or the department that is doing the test, when your results will be ready. Summary A coronary calcium scan is an imaging test used to look for deposits of calcium and other fatty materials (plaques) in the inner lining of the blood vessels of the heart (coronary arteries). Generally, this is a safe procedure. Tell your health care provider if you are pregnant or may be pregnant. No preparation is needed for this procedure. A CT scanner will take pictures of your heart. You can return to your normal activities after the scan is done. This information is not  intended to replace advice given to you by your health care provider. Make sure you discuss any questions you have with your health care provider. Document Released: 09/11/2007 Document Revised: 02/02/2016 Document Reviewed: 02/02/2016 Elsevier Interactive Patient Education  2017 [employer].    Follow-Up: At [facility], you and your health needs are our priority.  As part of our  continuing mission to provide you with exceptional heart care, we have created designated Provider Care Teams.  These Care Teams include your primary Cardiologist (physician) and Advanced Practice Providers (APPs -  Physician Assistants and Nurse Practitioners) who all work together to provide you with the care you need, when you need it.  We recommend signing up for the patient portal called "MyChart".  Sign up information is provided on this After Visit Summary.  MyChart is used to connect with patients for Virtual Visits (Telemedicine).  Patients are able to view lab/test results, encounter notes, upcoming appointments, etc.  Non-urgent messages can be sent to your provider as well.   To learn more about what you can do with MyChart, go to [email or web address].    Your next appointment:    4 months with Dr. Rennis Golden

## 2022-03-03 NOTE — Telephone Encounter (Signed)
At office visit, noticed some of the pt demographics were confusing, so wanted to clarify. Attempted to call pt, left detailed message--ok per DPR--and asked to call back.

## 2022-03-03 NOTE — Progress Notes (Signed)
LIPID CLINIC CONSULT NOTE  Chief Complaint:  Manage dyslipidemia  Primary Care Physician: Elenore Paddy, NP  Primary Cardiologist:  None  HPI:  [name] is a 41 y.o. adult who is being seen today for the evaluation of dyslipidemia at the request of Marcelle Overlie, MD. this is a pleasant 41 year old who is kindly referred for evaluation and management of dyslipidemia.  There has been a history of high cholesterol with a strong family history of high cholesterol and heart disease both in mom and dad as well as an uncle and maternal grandmother.  Other cardiac risk factors include hypertension.  The current bp medication is labetalol.  I do not note the patient is taking any lipid-lowering medications.  The last lipid profile in October showed total cholesterol 240, HDL 57, triglycerides 123 and LDL 158.  It has been reported that the cholesterol has been high for a long time.  There is no known cardiovascular disease.  PMHx:  Past Medical History:  Diagnosis Date   Anxiety    xanax prn   Closed fracture of lateral malleolus 09/10/2019   Complication of anesthesia    feels panic with face mask   Depression    Headache(784.0)    hx migraines   Hiatal hernia    Hypertension    previously on BP med - none currently   Recurrent dry cough    Sleep apnea    unable to tolerate mask     Past Surgical History:  Procedure Laterality Date   APPENDECTOMY     BREATH TEK H PYLORI N/A 07/20/2013   Procedure: BREATH TEK H PYLORI;  Surgeon: Kandis Cocking, MD;  Location: [facility];  Service: General;  Laterality: N/A;   CHOLECYSTECTOMY     LAPAROSCOPIC GASTRIC SLEEVE RESECTION N/A 09/18/2013   Procedure: LAPAROSCOPIC GASTRIC SLEEVE RESECTION UPPER ENDOSCOPY;  Surgeon: Kandis Cocking, MD;  Location: [facility];  Service: General;  Laterality: N/A;   ORIF ANKLE FRACTURE Left 08/30/2019   Procedure: OPEN REDUCTION INTERNAL FIXATION (ORIF) Left ankle lateral malleolus fracture;  Surgeon:  Toni Arthurs, MD;  Location: [facility];  Service: Orthopedics;  Laterality: Left;     FAMHx:  Family History  Problem Relation Age of Onset   Depression Mother    Kidney disease Mother    Liver disease Mother    Diverticulitis Mother    Heart failure Father    Diabetes Father    Psychiatric Illness Sister    Breast cancer Neg Hx     SOCHx:   reports that he has never smoked. He has never used smokeless tobacco. He reports current alcohol use. He reports that he does not use drugs.  ALLERGIES:  No Known Allergies  ROS: Pertinent items noted in HPI and remainder of comprehensive ROS otherwise negative.  HOME MEDS: Current Outpatient Medications on File Prior to Visit  Medication Sig Dispense Refill   acetaminophen (TYLENOL) 325 MG tablet Take 650 mg by mouth every 6 (six) hours as needed.     ALPRAZolam (XANAX) 0.5 MG tablet Take 1 tablet (0.5 mg total) by mouth 2 (two) times daily as needed. 30 tablet 0   Cholecalciferol 125 MCG (5000 UT) TABS Take 1 tablet by mouth daily.     FLUoxetine (PROZAC) 20 MG capsule Take 3 capsules (60 mg total) by mouth daily. 90 capsule 3   ibuprofen (ADVIL) 400 MG tablet Take 400 mg by mouth every 6 (six) hours as needed.  labetalol (NORMODYNE) 100 MG tablet Take 1 tablet (100 mg total) by mouth 2 (two) times daily. 60 tablet 2   temazepam (RESTORIL) 30 MG capsule Take 1 capsule (30 mg total) by mouth at bedtime as needed. 30 capsule 0   No current facility-administered medications on file prior to visit.    LABS/IMAGING: No results found for this or any previous visit (from the past 48 hour(s)). No results found.  LIPID PANEL:    Component Value Date/Time   CHOL 240 (H) 01/08/2022 1527   CHOL 283 (H) 06/21/2019 1219   TRIG 123.0 01/08/2022 1527   HDL 57.50 01/08/2022 1527   HDL 68 06/21/2019 1219   CHOLHDL 4 01/08/2022 1527   VLDL 24.6 01/08/2022 1527   LDLCALC 158 (H) 01/08/2022 1527   LDLCALC 176 (H)  06/21/2019 1219    WEIGHTS: Wt Readings from Last 3 Encounters:  03/03/22 263 lb (119.3 kg)  02/11/22 266 lb (120.7 kg)  01/08/22 265 lb (120.2 kg)    VITALS: BP 122/86   Pulse 80   Ht 5\' 7"  (1.702 m)   Wt 263 lb (119.3 kg)   LMP 01/14/2022   BMI 41.19 kg/m   EXAM: Deferred  EKG: Deferred  ASSESSMENT: Mixed dyslipidemia Family history of high cholesterol and multiple family members Hypertension  PLAN: 1.   [name] is considered low risk for cardiovascular disease given relatively few risk factors although does have family members with high cholesterol.  It may be helpful to further risk stratify her with calcium scoring.  In addition, I think it would be of benefit to check an LP(a).  We can make a treatment decision based on this.  I did discuss dietary modification and activity which are important management options.  Plan follow-up with me in 3 to 4 months.  Thanks as always for the kind referral.  Sudie Bailey, MD, Chrystie Nose  [place]  [facility]  Medical Director of the [facility] Diplomate of the American Board of Clinical Lipidology Attending Cardiologist  Direct Dial: [phone]  Fax: [phone]  Website:  [email or web address] Channing Savich 03/03/2022, 8:00 PM

## 2022-03-11 ENCOUNTER — Encounter: Payer: Self-pay | Admitting: Nurse Practitioner

## 2022-04-13 ENCOUNTER — Other Ambulatory Visit (HOSPITAL_BASED_OUTPATIENT_CLINIC_OR_DEPARTMENT_OTHER): Payer: BC Managed Care – PPO

## 2022-05-07 ENCOUNTER — Other Ambulatory Visit: Payer: Self-pay | Admitting: *Deleted

## 2022-05-07 DIAGNOSIS — E785 Hyperlipidemia, unspecified: Secondary | ICD-10-CM

## 2022-05-09 ENCOUNTER — Other Ambulatory Visit: Payer: Self-pay | Admitting: Nurse Practitioner

## 2022-05-09 DIAGNOSIS — I10 Essential (primary) hypertension: Secondary | ICD-10-CM

## 2022-05-11 ENCOUNTER — Other Ambulatory Visit: Payer: Self-pay | Admitting: Nurse Practitioner

## 2022-05-11 DIAGNOSIS — F32A Depression, unspecified: Secondary | ICD-10-CM

## 2022-05-17 NOTE — Telephone Encounter (Signed)
Patient called again and needs this medication called into the pharmacy

## 2022-05-20 ENCOUNTER — Ambulatory Visit: Payer: BC Managed Care – PPO | Admitting: Nurse Practitioner

## 2022-05-20 VITALS — BP 116/74 | HR 90 | Temp 97.8°F | Ht 67.0 in | Wt 258.0 lb

## 2022-05-20 DIAGNOSIS — F32A Depression, unspecified: Secondary | ICD-10-CM

## 2022-05-20 DIAGNOSIS — E785 Hyperlipidemia, unspecified: Secondary | ICD-10-CM | POA: Diagnosis not present

## 2022-05-20 DIAGNOSIS — F419 Anxiety disorder, unspecified: Secondary | ICD-10-CM

## 2022-05-20 NOTE — Progress Notes (Signed)
Established Patient Office Visit  Subjective   Patient ID: [name], adult    DOB: [birth year]  Age: 42 y.o. MRN: [id number]  Chief Complaint  Patient presents with   Depression    Mood stable slightly better compared to last time I saw her.  Continues on Prozac 60 mg a day.  Also on Xanax as needed and Restoril at bedtime.  Continuing on vitamin D supplementation, tolerating well.  Denies suicidal ideation.  PHQ-9 today is 0.  Has hyperlipidemia, being followed by Dr. Debara Pickett.  Not currently on medication to treat this.  Last LDL 158, ASCVD risk were approximately 1%.    Review of Systems  Respiratory:  Negative for shortness of breath.   Cardiovascular:  Negative for chest pain.  Psychiatric/Behavioral:  Positive for depression. Negative for suicidal ideas. The patient has insomnia.       Objective:     BP 116/74   Pulse 90   Temp 97.8 F (36.6 C) (Temporal)   Ht 5' 7"$  (1.702 m)   Wt 258 lb (117 kg)   SpO2 97%   BMI 40.41 kg/m  BP Readings from Last 3 Encounters:  05/20/22 116/74  03/03/22 122/86  02/11/22 120/70   Wt Readings from Last 3 Encounters:  05/20/22 258 lb (117 kg)  03/03/22 263 lb (119.3 kg)  02/11/22 266 lb (120.7 kg)      Physical Exam Vitals reviewed.  Constitutional:      General: He is not in acute distress.    Appearance: Normal appearance.  HENT:     Head: Normocephalic and atraumatic.  Neck:     Vascular: No carotid bruit.  Cardiovascular:     Rate and Rhythm: Normal rate and regular rhythm.     Pulses: Normal pulses.     Heart sounds: Normal heart sounds.  Pulmonary:     Effort: Pulmonary effort is normal.     Breath sounds: Normal breath sounds.  Skin:    General: Skin is warm and dry.  Neurological:     General: No focal deficit present.     Mental Status: He is alert and oriented to person, place, and time.  Psychiatric:        Mood and Affect: Mood normal.        Behavior: Behavior normal.        Judgment: Judgment  normal.        05/20/2022    3:05 PM 02/11/2022    8:05 AM 01/08/2022    2:53 PM  PHQ9 SCORE ONLY  PHQ-9 Total Score 0 4 16     No results found for any visits on 05/20/22.    The 10-year ASCVD risk score (Arnett DK, et al., 2019) is: 1%    Assessment & Plan:   Problem List Items Addressed This Visit       Other   Anxiety and depression    Chronic, continues to improve however still seems suboptimally controlled per patient.  PHQ-9 much better at 0 today.  Per shared decision making patient will continue on Prozac 60 mg a day, Restoril 30 mg at bedtime, and Xanax 0.5 mg twice a day as needed.  I again encouraged her to consider psychiatry referral, patient has declined this referral for now.      Hyperlipidemia - Primary    Chronic, per shared decision making we will not initiate on statin therapy as ASCVD risk score and LDL levels are low enough that I would not recommend it  as of right now.  Continue to check lipid panel yearly and encourage patient to follow healthy lifestyle.       Return for Cpe with Judson Roch anytime after 01/09/23.    Ailene Ards, NP

## 2022-05-20 NOTE — Assessment & Plan Note (Signed)
Chronic, per shared decision making we will not initiate on statin therapy as ASCVD risk score and LDL levels are low enough that I would not recommend it as of right now.  Continue to check lipid panel yearly and encourage patient to follow healthy lifestyle.

## 2022-05-20 NOTE — Assessment & Plan Note (Signed)
Chronic, continues to improve however still seems suboptimally controlled per patient.  PHQ-9 much better at 0 today.  Per shared decision making patient will continue on Prozac 60 mg a day, Restoril 30 mg at bedtime, and Xanax 0.5 mg twice a day as needed.  I again encouraged her to consider psychiatry referral, patient has declined this referral for now.

## 2022-05-25 ENCOUNTER — Telehealth: Payer: Self-pay | Admitting: Nurse Practitioner

## 2022-05-25 DIAGNOSIS — F32A Depression, unspecified: Secondary | ICD-10-CM

## 2022-05-25 MED ORDER — FLUOXETINE HCL 20 MG PO CAPS: 60.0000 mg | ORAL_CAPSULE | Freq: Every day | ORAL | 2 refills | Status: AC

## 2022-05-25 NOTE — Telephone Encounter (Signed)
MEDICATION:FLUoxetine (PROZAC) 20 MG capsule   PHARMACY:[facility] - [place], [place] - [address]   Comments:   **Let patient know to contact pharmacy at the end of the day to make sure medication is ready. **  ** Please notify patient to allow 48-72 hours to process**  **Encourage patient to contact the pharmacy for refills or they can request refills through [facility]**

## 2022-05-25 NOTE — Telephone Encounter (Signed)
Refill has been sent to [place].Marland KitchenJohny Shepard

## 2022-07-21 ENCOUNTER — Ambulatory Visit (HOSPITAL_BASED_OUTPATIENT_CLINIC_OR_DEPARTMENT_OTHER): Payer: BC Managed Care – PPO | Admitting: Internal Medicine

## 2023-03-20 ENCOUNTER — Other Ambulatory Visit: Payer: Self-pay | Admitting: Nurse Practitioner

## 2023-03-20 DIAGNOSIS — G4700 Insomnia, unspecified: Secondary | ICD-10-CM

## 2023-03-20 DIAGNOSIS — F419 Anxiety disorder, unspecified: Secondary | ICD-10-CM

## 2023-03-21 MED ORDER — TEMAZEPAM 30 MG PO CAPS: 30.0000 mg | ORAL_CAPSULE | Freq: Every evening | ORAL | 0 refills | Status: DC | PRN

## 2023-05-02 ENCOUNTER — Other Ambulatory Visit: Payer: Self-pay | Admitting: Internal Medicine

## 2023-05-02 DIAGNOSIS — F32A Depression, unspecified: Secondary | ICD-10-CM

## 2023-05-02 DIAGNOSIS — G4700 Insomnia, unspecified: Secondary | ICD-10-CM

## 2023-10-07 IMAGING — MG DIGITAL DIAGNOSTIC UNILAT LEFT W/ CAD
4 series · 4 of 8 positions shown · non-contrast
Comparison: Previous exam(s).

CLINICAL DATA: Patient recalled from screening for lateral left
breast calcifications.

EXAM:
DIGITAL DIAGNOSTIC UNILATERAL LEFT MAMMOGRAM WITH CAD
TECHNIQUE: Left digital diagnostic mammography was performed. Mammographic
images were processed with CAD.

[L ML]
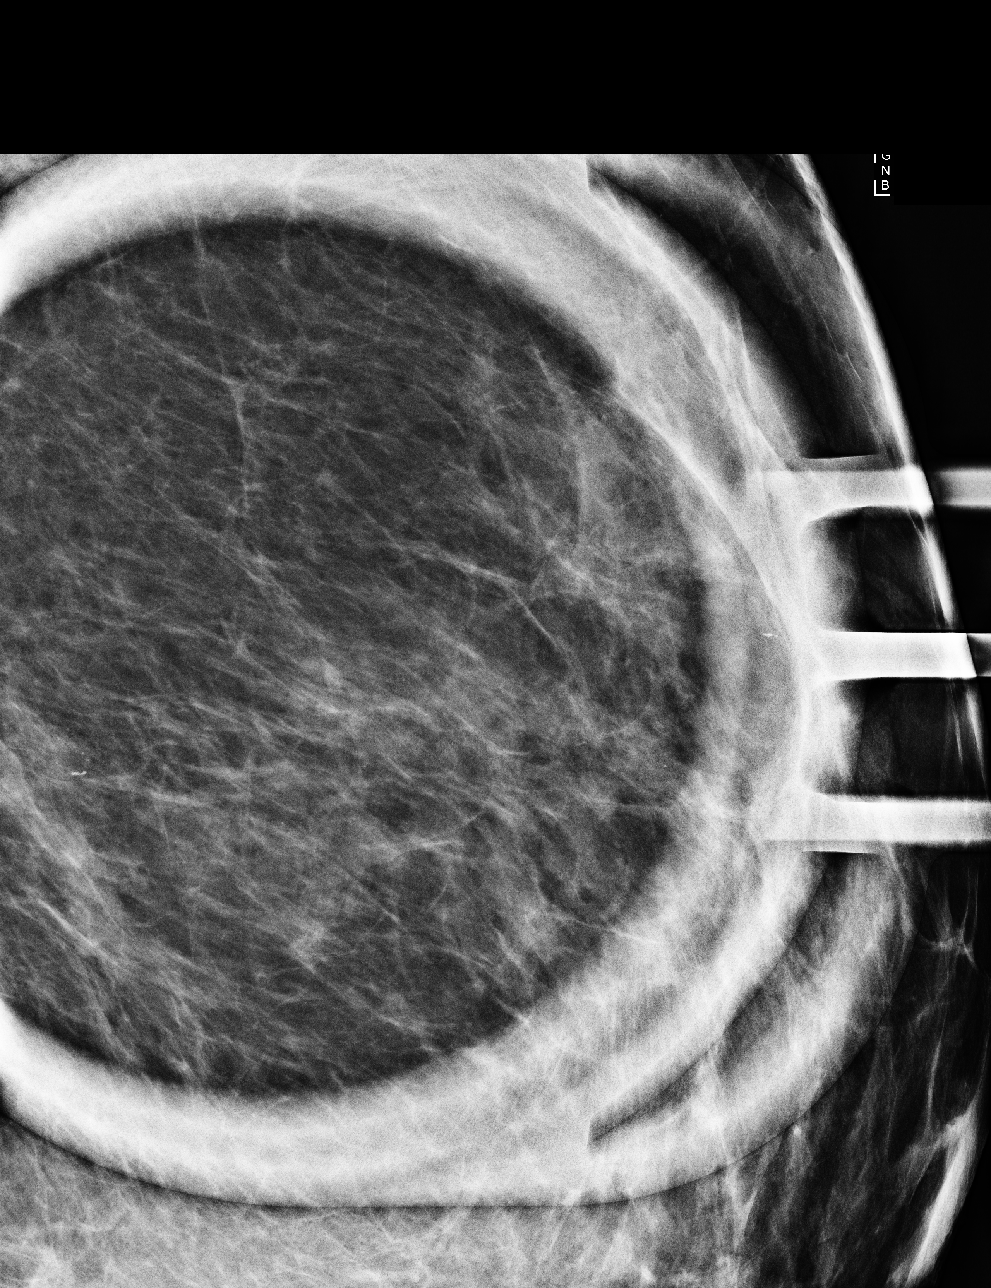

[L CC]
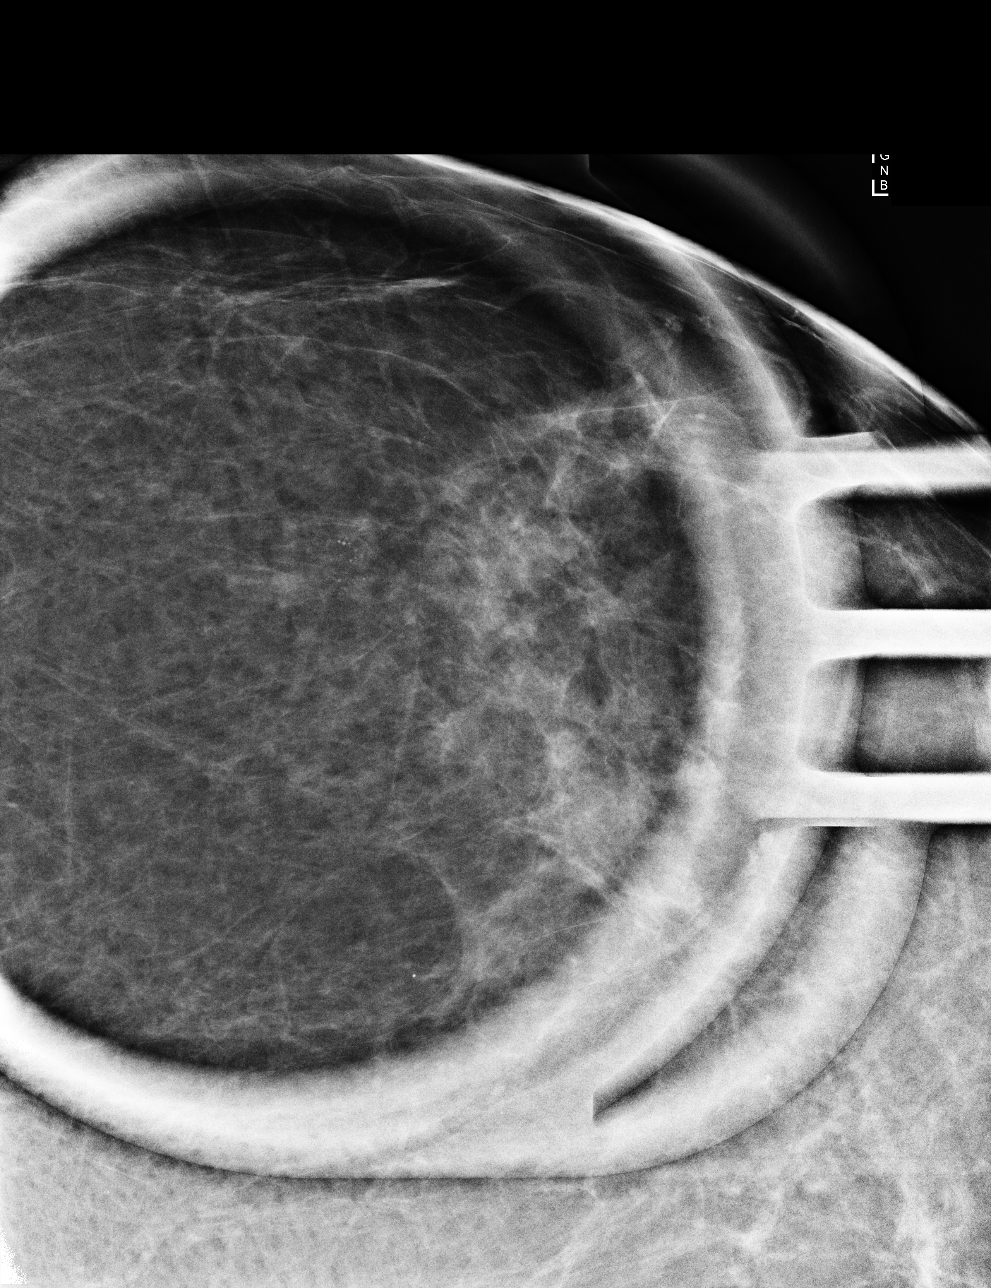

[L ML synth-2D]
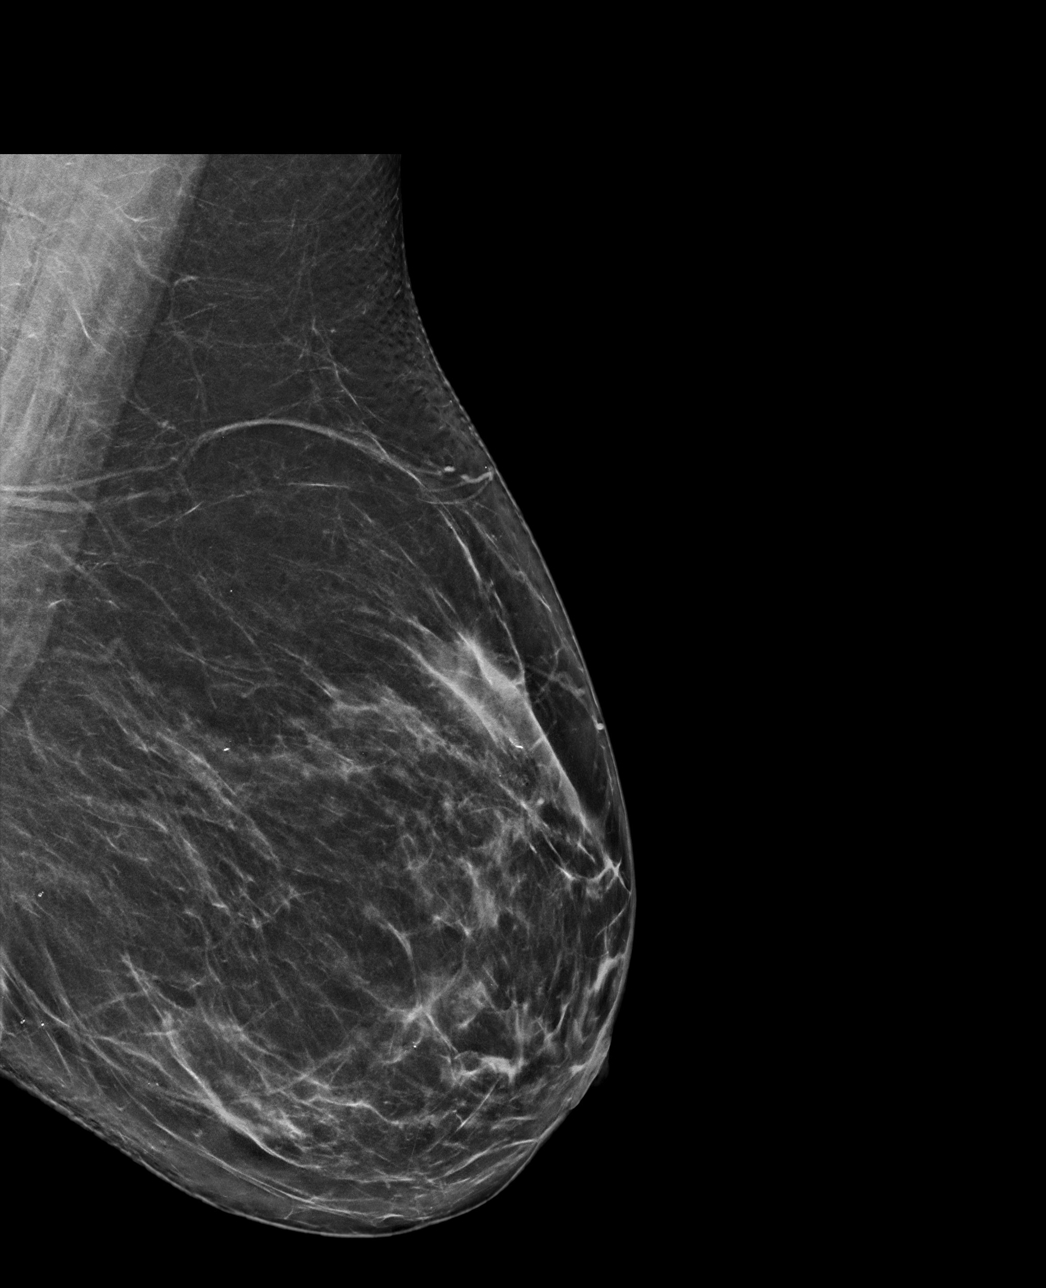

[L ML tomo · tomo slice 39/76.0]
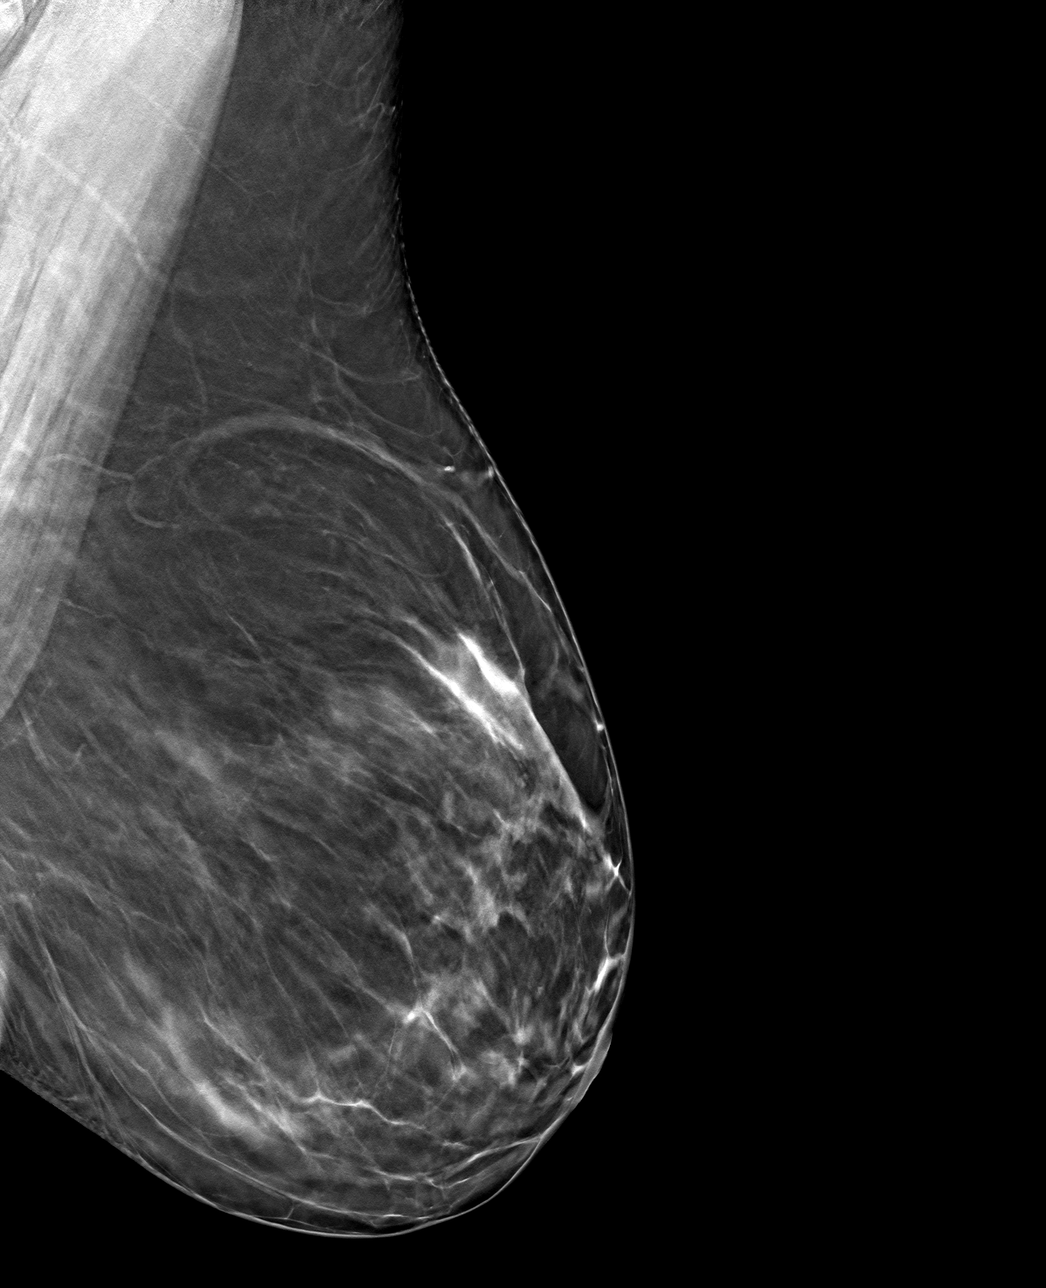

[4 of 8 positions shown; findings below may reference images not displayed]

ACR Breast Density Category c: The breast tissue is heterogeneously
dense, which may obscure small masses.
FINDINGS: Magnification views of the outer left breast were obtained. There
are loosely grouped calcifications which appear to layer on the true
lateral view suggestive of milk of calcium.
IMPRESSION: Probably benign left breast calcifications.

RECOMMENDATION:
Left breast diagnostic mammogram with magnification views in 6
months to reassess the probably benign left breast calcifications.

I have discussed the findings and recommendations with the patient.
If applicable, a reminder letter will be sent to the patient
regarding the next appointment.

BI-RADS CATEGORY  3: Probably benign.

## 2024-04-20 ENCOUNTER — Ambulatory Visit: Payer: Self-pay
